# Patient Record
Sex: Female | Born: 1965 | Race: White | Hispanic: No | Marital: Single | Smoking: Never smoker
Health system: Southern US, Community
[De-identification: ages and names within clinical notes are randomized; demographics above are authoritative.]

## PROBLEM LIST (undated history)

## (undated) DIAGNOSIS — F411 Generalized anxiety disorder: Secondary | ICD-10-CM

## (undated) DIAGNOSIS — T7840XA Allergy, unspecified, initial encounter: Secondary | ICD-10-CM

## (undated) DIAGNOSIS — E039 Hypothyroidism, unspecified: Secondary | ICD-10-CM

## (undated) DIAGNOSIS — F329 Major depressive disorder, single episode, unspecified: Secondary | ICD-10-CM

## (undated) DIAGNOSIS — L309 Dermatitis, unspecified: Secondary | ICD-10-CM

## (undated) HISTORY — PX: BREAST SURGERY: SHX581

## (undated) HISTORY — DX: Dermatitis, unspecified: L30.9

## (undated) HISTORY — DX: Major depressive disorder, single episode, unspecified: F32.9

## (undated) HISTORY — DX: Generalized anxiety disorder: F41.1

## (undated) HISTORY — DX: Allergy, unspecified, initial encounter: T78.40XA

## (undated) HISTORY — DX: Hypothyroidism, unspecified: E03.9

## (undated) HISTORY — PX: BREAST LUMPECTOMY: SHX2

---

## 1997-06-15 ENCOUNTER — Ambulatory Visit (HOSPITAL_BASED_OUTPATIENT_CLINIC_OR_DEPARTMENT_OTHER): Admission: RE | Admit: 1997-06-15 | Discharge: 1997-06-15 | Payer: Self-pay | Admitting: Specialist

## 1998-02-13 ENCOUNTER — Other Ambulatory Visit: Admission: RE | Admit: 1998-02-13 | Discharge: 1998-02-13 | Payer: Self-pay | Admitting: Family Medicine

## 1999-04-15 ENCOUNTER — Encounter: Admission: RE | Admit: 1999-04-15 | Discharge: 1999-04-15 | Payer: Self-pay | Admitting: Family Medicine

## 1999-04-15 ENCOUNTER — Encounter: Payer: Self-pay | Admitting: Family Medicine

## 2000-06-24 ENCOUNTER — Encounter: Admission: RE | Admit: 2000-06-24 | Discharge: 2000-06-24 | Payer: Self-pay | Admitting: Family Medicine

## 2000-06-24 ENCOUNTER — Encounter: Payer: Self-pay | Admitting: Family Medicine

## 2001-07-19 ENCOUNTER — Encounter: Admission: RE | Admit: 2001-07-19 | Discharge: 2001-07-19 | Payer: Self-pay | Admitting: Family Medicine

## 2001-07-19 ENCOUNTER — Encounter: Payer: Self-pay | Admitting: Family Medicine

## 2002-07-21 ENCOUNTER — Encounter: Payer: Self-pay | Admitting: Family Medicine

## 2002-07-21 ENCOUNTER — Encounter: Admission: RE | Admit: 2002-07-21 | Discharge: 2002-07-21 | Payer: Self-pay | Admitting: Family Medicine

## 2004-12-12 ENCOUNTER — Ambulatory Visit: Payer: Self-pay | Admitting: Family Medicine

## 2005-01-08 ENCOUNTER — Ambulatory Visit (HOSPITAL_COMMUNITY): Admission: RE | Admit: 2005-01-08 | Discharge: 2005-01-08 | Payer: Self-pay | Admitting: Family Medicine

## 2006-01-15 ENCOUNTER — Ambulatory Visit (HOSPITAL_COMMUNITY): Admission: RE | Admit: 2006-01-15 | Discharge: 2006-01-15 | Payer: Self-pay | Admitting: Family Medicine

## 2007-01-13 LAB — HM MAMMOGRAPHY: HM Mammogram: NORMAL

## 2007-02-17 ENCOUNTER — Encounter: Admission: RE | Admit: 2007-02-17 | Discharge: 2007-02-17 | Payer: Self-pay | Admitting: Family Medicine

## 2007-10-13 LAB — CONVERTED CEMR LAB: Pap Smear: NORMAL

## 2008-04-07 ENCOUNTER — Encounter: Admission: RE | Admit: 2008-04-07 | Discharge: 2008-04-07 | Payer: Self-pay | Admitting: Family Medicine

## 2008-04-30 ENCOUNTER — Encounter: Admission: RE | Admit: 2008-04-30 | Discharge: 2008-05-11 | Payer: Self-pay | Admitting: Neurosurgery

## 2008-08-14 ENCOUNTER — Ambulatory Visit: Payer: Self-pay | Admitting: Family Medicine

## 2008-08-14 DIAGNOSIS — F411 Generalized anxiety disorder: Secondary | ICD-10-CM

## 2008-08-14 DIAGNOSIS — E039 Hypothyroidism, unspecified: Secondary | ICD-10-CM | POA: Insufficient documentation

## 2008-08-14 DIAGNOSIS — F339 Major depressive disorder, recurrent, unspecified: Secondary | ICD-10-CM | POA: Insufficient documentation

## 2008-08-14 DIAGNOSIS — F329 Major depressive disorder, single episode, unspecified: Secondary | ICD-10-CM

## 2008-08-14 DIAGNOSIS — F3289 Other specified depressive episodes: Secondary | ICD-10-CM

## 2008-08-14 HISTORY — DX: Generalized anxiety disorder: F41.1

## 2008-08-14 HISTORY — DX: Hypothyroidism, unspecified: E03.9

## 2008-08-14 HISTORY — DX: Other specified depressive episodes: F32.89

## 2008-08-14 HISTORY — DX: Major depressive disorder, single episode, unspecified: F32.9

## 2008-09-25 ENCOUNTER — Ambulatory Visit (HOSPITAL_COMMUNITY): Admission: RE | Admit: 2008-09-25 | Discharge: 2008-09-25 | Payer: Self-pay | Admitting: Internal Medicine

## 2010-07-22 ENCOUNTER — Other Ambulatory Visit (HOSPITAL_COMMUNITY): Payer: Self-pay | Admitting: Obstetrics and Gynecology

## 2010-07-22 DIAGNOSIS — R1031 Right lower quadrant pain: Secondary | ICD-10-CM

## 2010-07-24 ENCOUNTER — Ambulatory Visit (HOSPITAL_COMMUNITY): Payer: Self-pay

## 2010-12-16 ENCOUNTER — Telehealth: Payer: Self-pay | Admitting: Family Medicine

## 2010-12-16 NOTE — Telephone Encounter (Signed)
I have attempted to contact this patient by phone with the following results: left message to return my call on answering machine (home).  

## 2010-12-19 NOTE — Telephone Encounter (Signed)
No return call from Nancy Choi.  Follow up ended.

## 2011-05-29 ENCOUNTER — Encounter: Payer: Self-pay | Admitting: Family Medicine

## 2011-06-01 ENCOUNTER — Ambulatory Visit (INDEPENDENT_AMBULATORY_CARE_PROVIDER_SITE_OTHER): Payer: BC Managed Care – PPO | Admitting: Family Medicine

## 2011-06-01 ENCOUNTER — Encounter: Payer: Self-pay | Admitting: Family Medicine

## 2011-06-01 VITALS — BP 80/60 | Temp 97.8°F | Wt 132.0 lb

## 2011-06-01 DIAGNOSIS — H04123 Dry eye syndrome of bilateral lacrimal glands: Secondary | ICD-10-CM

## 2011-06-01 DIAGNOSIS — H04129 Dry eye syndrome of unspecified lacrimal gland: Secondary | ICD-10-CM

## 2011-06-01 MED ORDER — CYCLOSPORINE 0.05 % OP EMUL: 1.0000 [drp] | Freq: Two times a day (BID) | OPHTHALMIC | Status: AC

## 2011-06-01 NOTE — Patient Instructions (Signed)
We will call you regarding lab results  Sjgren's Syndrome Sjgren's syndrome is a disease in which the body's natural defense system (immune system) turns against the body's own cells and attacks the body's glands that produce tears and saliva. Sjgren's syndrome is sometimes linked to rheumatic disorders, such as rheumatoid arthritis and lupus. It is 10 times more common in women than in men. Most people with Sjgren's syndrome are from 29 to 47 years of age. CAUSES  The cause is unknown. It runs in families. It is possible that a trigger, such as a viral infection, can set it off. SYMPTOMS  The main symptoms are:  Dry mouth.   Chalky feeling, mouth feels like it is full of cotton.   Difficulty swallowing, speaking, or tasting.   Prone to cavities and mouth infections.   Dry eyes.   Burning, itching, feels like sand in the eyes.   Blurry vision.   Light sensitive.  Other symptoms may include:  Skin, nose, and vaginal dryness.   Joint pain, stiffness, and muscle pain.  Other organs that may be affected include:  Kidneys.   Blood vessels.   Lungs.   Liver.   Pancreas.   Brain.  DIAGNOSIS  Diagnosis is first based on symptoms, medical history, and physical exam. Tests may also be done, including:  Tear production test (Schirmer's test).   A thorough eye exam using a magnifying device (slit-lamp exam).   Test to see the extent of eye damage using dye staining.   Mouth exam to look for signs of salivary gland swelling and mouth dryness.   Removal of a minor salivary gland from inside the lower lip to be studied under a microscope (lip biopsy).   Blood tests. Routine and special blood studies will be checked. Antibodies that attack normal tissue (autoantibodies) may be present, such as antinuclear antibodies (ANAs), rheumatoid factors, and Sjgren's antibodies (anti-SSA and anti-SSB).   Chest X-ray.   Urine tests (urinalysis).  TREATMENT  There is no known  cure for this syndrome. There is no specific treatment to restore gland secretion, either. Treatment varies and is generally based upon the problems present.  Moisture replacement therapies may ease the symptoms of dryness.   Nonsteroidal anti-inflammatory drugs (NSAIDs), such as ibuprofen, may be used to treat musculoskeletal symptoms.   Corticosteroids, such as prednisone, may be given for people with severe complications.   Immunosuppressive drugs may be prescribed to control overactivity of the immune system. In severe cases, this overactivity can lead to organ damage.   A surgical procedure called punctal occlusion may be considered. This is done to close the tear ducts, helping to keep more natural tears on the eye's surface.  A small percentage of people with Sjgren's syndrome develop lymphoma. If you are worried that you might develop lymphoma, talk to your caregiver to learn more about the disease and the symptoms to watch. HOME CARE INSTRUCTIONS   Eye care:   Use eyedrops as specified by your caregiver.   Try to blink 5 to 6 times per minute.   Protect your eyes from drafts and breezes.   Maintain properly humidified air.   Avoid smoke.   Mouth care:   Sugar-free gum and hard candy can help in some people.   Take frequent sips of water or sugar-free drinks.   Use lip balm, saliva substitutes, and prescription medicines as directed.  SEEK MEDICAL CARE IF:   You have an oral temperature above 102 F (38.9 C).   You  have night sweats.   You develop constant fatigue.   You have unexplained weight loss.   You develop itchy skin.   You have reddened patches on the skin.  FOR MORE INFORMATION  Sjgren's Syndrome Foundation: [email or web address].[email or web address] of Arthritis and Musculoskeletal and Skin Diseases: [email or web address]/ Document Released: 12/19/2001 Document Revised: 12/18/2010 Document Reviewed: 05/06/2009 [employer] Patient Information 2012 [place],  [place].

## 2011-06-01 NOTE — Progress Notes (Signed)
  Subjective:    Patient ID: Nancy Choi, female    DOB: 1965/06/19, 46 y.o.   MRN: [id number]  HPI  Complains of severe dry eyes right greater than left. She has hypothyroidism followed by someone else with recent TSH reportedly normal. Also had recent ophthalmology appointment normal eye exam. She has dryness in both eyes and frequent irritation. Occasional photophobia. No drainage. No blurred vision. Occasional mild eye pain. Ophthalmologist prescribed ketorolac eyedrops which have not helped. She's tried refresh eye drops which help minimally. Previously used Restasis with good success and is requesting prescription. She also frequently has dry mouth. No urine frequency. She attributes dry mouth to Effexor. No family history of connective tissue disorder. Never tested for Sjogren's syndrome.   Review of Systems  Constitutional: Negative for fever and chills.  Eyes: Positive for photophobia. Negative for discharge, redness, itching and visual disturbance.  Musculoskeletal: Negative for myalgias, back pain and arthralgias.       Objective:   Physical Exam  Constitutional: She appears well-developed and well-nourished.  HENT:  Mouth/Throat: Oropharynx is clear and moist.  Eyes: Conjunctivae and EOM are normal. Pupils are equal, round, and reactive to light. Right eye exhibits no discharge. Left eye exhibits no discharge. No scleral icterus.  Neck: Neck supple. No thyromegaly present.  Cardiovascular: Normal rate and regular rhythm.   Pulmonary/Chest: Effort normal and breath sounds normal. No respiratory distress. She has no wheezes. She has no rales.          Assessment & Plan:  Patient complains of dry eyes and dry mouth. Check Sjogren's antibody panel. Prescription for Restasis to use as needed.

## 2011-06-02 LAB — SJOGREN'S SYNDROME ANTIBODS(SSA + SSB)
SSA (Ro) (ENA) Antibody, IgG: 1 AU/mL (ref <30)
SSB (La) (ENA) Antibody, IgG: <1 AU/mL (ref <30)

## 2011-06-03 NOTE — Progress Notes (Signed)
Quick Note:  Pt informed ______ 

## 2011-08-31 ENCOUNTER — Ambulatory Visit (INDEPENDENT_AMBULATORY_CARE_PROVIDER_SITE_OTHER): Payer: BC Managed Care – PPO | Admitting: Family Medicine

## 2011-08-31 ENCOUNTER — Encounter: Payer: Self-pay | Admitting: Family Medicine

## 2011-08-31 VITALS — BP 102/68 | Temp 101.8°F | Wt 134.0 lb

## 2011-08-31 DIAGNOSIS — R509 Fever, unspecified: Secondary | ICD-10-CM

## 2011-08-31 DIAGNOSIS — J029 Acute pharyngitis, unspecified: Secondary | ICD-10-CM

## 2011-08-31 LAB — POCT RAPID STREP A (OFFICE): Rapid Strep A Screen: NEGATIVE

## 2011-08-31 MED ORDER — AMOXICILLIN 875 MG PO TABS: 875.0000 mg | ORAL_TABLET | Freq: Two times a day (BID) | ORAL | Status: AC

## 2011-08-31 NOTE — Patient Instructions (Addendum)
Continue Tylenol and/or Motrin for fever and body aches.

## 2011-08-31 NOTE — Progress Notes (Signed)
  Subjective:    Patient ID: Nancy Choi, female    DOB: 1966-01-02, 46 y.o.   MRN: [id number]  HPI  Acute visit. Patient presents with sore throat, chills, bodyaches with onset yesterday fairly acutely. No sick exposures though was around several nieces and nephews over the weekend. No cough. Minimal  nasal congestion. No headache. Mild nausea but no vomiting. She took Advil and Tylenol yesterday with mild relief. No skin rash. No abdominal pain. No dysuria   Review of Systems As above    Objective:   Physical Exam  Constitutional: She appears well-developed and well-nourished.  HENT:  Right Ear: External ear normal.  Left Ear: External ear normal.       Posterior pharynx is erythematous. No exudate  Neck: Neck supple.       Anterior cervical adenopathy. No posterior cervical nodes.  Cardiovascular: Normal rate and regular rhythm.   Pulmonary/Chest: Effort normal and breath sounds normal. No respiratory distress. She has no wheezes. She has no rales.  Lymphadenopathy:    She has cervical adenopathy.  Skin: No rash noted.          Assessment & Plan:  Pharyngitis. Rapid strep negative. May be all viral but concern for possible strep with lack of symptoms such as nasal congestion and cough. Throat culture sent. Cover with amoxicillin 875 mg twice a day in the meantime.

## 2012-01-29 ENCOUNTER — Other Ambulatory Visit: Payer: Self-pay | Admitting: Obstetrics and Gynecology

## 2012-01-29 DIAGNOSIS — R928 Other abnormal and inconclusive findings on diagnostic imaging of breast: Secondary | ICD-10-CM

## 2012-02-10 ENCOUNTER — Ambulatory Visit
Admission: RE | Admit: 2012-02-10 | Discharge: 2012-02-10 | Disposition: A | Discharge status: Home / Self Care | Payer: BC Managed Care – PPO | Source: Ambulatory Visit | Attending: Obstetrics and Gynecology | Admitting: Obstetrics and Gynecology

## 2012-02-10 DIAGNOSIS — R928 Other abnormal and inconclusive findings on diagnostic imaging of breast: Secondary | ICD-10-CM

## 2012-09-07 ENCOUNTER — Other Ambulatory Visit: Payer: Self-pay | Admitting: Obstetrics and Gynecology

## 2012-09-07 DIAGNOSIS — N63 Unspecified lump in unspecified breast: Secondary | ICD-10-CM

## 2012-09-21 ENCOUNTER — Ambulatory Visit (INDEPENDENT_AMBULATORY_CARE_PROVIDER_SITE_OTHER): Payer: BC Managed Care – PPO | Admitting: Family Medicine

## 2012-09-21 ENCOUNTER — Encounter: Payer: Self-pay | Admitting: Family Medicine

## 2012-09-21 VITALS — BP 102/70 | HR 123 | Temp 98.2°F | Ht 68.0 in | Wt 133.0 lb

## 2012-09-21 DIAGNOSIS — H6120 Impacted cerumen, unspecified ear: Secondary | ICD-10-CM

## 2012-09-21 DIAGNOSIS — E785 Hyperlipidemia, unspecified: Secondary | ICD-10-CM

## 2012-09-21 DIAGNOSIS — H6122 Impacted cerumen, left ear: Secondary | ICD-10-CM

## 2012-09-21 LAB — LIPID PANEL
LDL Cholesterol: 110 mg/dL — ABNORMAL HIGH (ref 0–99)
Total CHOL/HDL Ratio: 3

## 2012-09-21 NOTE — Progress Notes (Signed)
  Subjective:    Patient ID: Nancy Choi, female    DOB: 1965-02-15, 47 y.o.   MRN: [id number]  HPI Patient seen for the following issues Decreased hearing left ear. Symptoms are intermittent. Onset a few days ago. She first noted after getting some water in her ear. She has not had any consistent vertigo symptoms. No tinnitus. Denies any ear pain.  Also requesting lipid check. She's had hypercholesterolemia in the past but also excellent HDL. Overall low risk for CAD. Nonsmoker. She takes thyroid replacement and sees another physician to monitor that. Overall feels well  Past Medical History  Diagnosis Date  . HYPOTHYROIDISM 08/14/2008    Qualifier: Diagnosis of  By: Gabriel Rung LPN, Harriett Sine    . Generalized anxiety disorder 08/14/2008    Qualifier: Diagnosis of  By: Gabriel Rung LPN, Harriett Sine    . DEPRESSION 08/14/2008    Qualifier: Diagnosis of  By: Gabriel Rung LPN, Harriett Sine     No past surgical history on file.  reports that she has never smoked. She does not have any smokeless tobacco history on file. Her alcohol and drug histories are not on file. family history includes Cancer in her mother; Depression in her mother; Diabetes in her paternal grandmother; Hyperlipidemia in her father. Allergies  Allergen Reactions  . Codeine Sulfate     REACTION: irritable, bugs crawling on skin sensation      Review of Systems  Constitutional: Negative for fever and appetite change.  HENT: Positive for hearing loss. Negative for ear pain and ear discharge.        Objective:   Physical Exam  Constitutional: She appears well-developed and well-nourished.  HENT:  Minimal cerumen right canal. Cerumen impaction left canal  Neck: Neck supple.  Cardiovascular: Normal rate and regular rhythm.   Pulmonary/Chest: Effort normal and breath sounds normal. No respiratory distress. She has no wheezes. She has no rales.          Assessment & Plan:  #1 cerumen impaction left canal. Irrigation #2 hyperlipidemia.  Recheck fasting lipid panel. Overall low risk for atherosclerosis

## 2012-09-27 ENCOUNTER — Ambulatory Visit
Admission: RE | Admit: 2012-09-27 | Discharge: 2012-09-27 | Disposition: A | Discharge status: Home / Self Care | Payer: BC Managed Care – PPO | Source: Ambulatory Visit | Attending: Obstetrics and Gynecology | Admitting: Obstetrics and Gynecology

## 2012-09-27 DIAGNOSIS — N63 Unspecified lump in unspecified breast: Secondary | ICD-10-CM

## 2012-10-05 ENCOUNTER — Telehealth: Payer: Self-pay | Admitting: Family Medicine

## 2012-10-05 NOTE — Telephone Encounter (Signed)
Patient Information:  Caller Name: Kanani  Phone: [phone]  Patient: Nancy Choi, Nancy Choi  Gender: Female  DOB: 09/25/1965  Age: 47 Years  PCP: Evelena Peat [facility])  Pregnant: No  Office Follow Up:  Does the office need to follow up with this patient?: No  Instructions For The Office: N/A  RN Note:  Care advise provided understanding expressed.  Appt scheudled  Symptoms  Reason For Call & Symptoms: Patient is complaining of Chest cold, coughing since Friday 09/30/12 . At times chills and hot but has not taken temperature , +coughing non productive, back is sore from coughing.  Tiredness,  "All in my Chest".  no n/v/d . Eating and drinking normally. Episodic coughing and occasional wheeze  Reviewed Health History In EMR: Yes  Reviewed Medications In EMR: Yes  Reviewed Allergies In EMR: Yes  Reviewed Surgeries / Procedures: Yes  Date of Onset of Symptoms: 09/30/2012  Treatments Tried: Advil , OTC Delsym  Treatments Tried Worked: No  Any Fever: Yes  Fever Taken: Tactile  Fever Time Of Reading: 00:00:00  Fever Last Reading: N/A OB / GYN:  LMP: 09/24/2012  Guideline(s) Used:  Cough  Disposition Per Guideline:   See Today or Tomorrow in Office  Reason For Disposition Reached:   Continuous (nonstop) coughing interferes with work or school and no improvement using cough treatment per Care Advice  Advice Given:  Coughing Spasms:  Drink warm fluids. Inhale warm mist (Reason: both relax the airway and loosen up the phlegm).  Suck on cough drops or hard candy to coat the irritated throat.  Avoid Tobacco Smoke:  Smoking or being exposed to smoke makes coughs much worse.  Call Back If:  Difficulty breathing  Cough lasts more than 3 weeks  Fever lasts > 3 days  You become worse.  Caution - Dextromethorphan:   CONTRAINDICATED: Do not take dextromethorphan if you are taking a monoamine oxidase (MAO) inhibitor now or in the past 2 weeks. Examples of MAO inhibitors  include isocarboxazid (Marplan), phenelzine (Nardil), selegiline (Eldepryl, Emsam, Zelapar), and tranylcypromine (Parnate). Do not take dextromethorphan if you are taking venlafaxine (Effexor).  Cough Medicines:  Home Remedy - Hard Candy: Hard candy works just as well as medicine-flavored OTC cough drops. Diabetics should use sugar-free candy.  Home Remedy - Honey: This old home remedy has been shown to help decrease coughing at night. The adult dosage is 2 teaspoons (10 ml) at bedtime. Honey should not be given to infants under one year of age.  Patient Will Follow Care Advice:  YES  Appointment Scheduled:  10/06/2012 08:30:00 Appointment Scheduled Provider:  Darryll Capers (Adults only)

## 2012-10-06 ENCOUNTER — Encounter: Payer: Self-pay | Admitting: Family Medicine

## 2012-10-06 ENCOUNTER — Ambulatory Visit: Payer: Self-pay | Admitting: Internal Medicine

## 2012-10-06 ENCOUNTER — Ambulatory Visit (INDEPENDENT_AMBULATORY_CARE_PROVIDER_SITE_OTHER): Payer: BC Managed Care – PPO | Admitting: Family Medicine

## 2012-10-06 VITALS — BP 112/80 | HR 101 | Temp 98.6°F | Wt 132.0 lb

## 2012-10-06 DIAGNOSIS — B9789 Other viral agents as the cause of diseases classified elsewhere: Secondary | ICD-10-CM

## 2012-10-06 DIAGNOSIS — B349 Viral infection, unspecified: Secondary | ICD-10-CM

## 2012-10-06 MED ORDER — HYDROCODONE-HOMATROPINE 5-1.5 MG/5ML PO SYRP: 5.0000 mL | ORAL_SOLUTION | Freq: Four times a day (QID) | ORAL | Status: AC | PRN

## 2012-10-06 NOTE — Progress Notes (Signed)
  Subjective:    Patient ID: Nancy Choi, female    DOB: 1965/12/19, 47 y.o.   MRN: [id number]  HPI Acute visit. Patient seen with onset of illness last Friday. She's had mostly cough, headache, malaise, sore throat. Minimal nasal congestion. No nausea or vomiting. Denies any documented fever or chills. Cough is been especially bothersome. Not relieved with over-the-counter medications. She's tried Delsym. Patient is nonsmoker. No history of lung disease.  Past Medical History  Diagnosis Date  . HYPOTHYROIDISM 08/14/2008    Qualifier: Diagnosis of  By: Gabriel Rung LPN, Harriett Sine    . Generalized anxiety disorder 08/14/2008    Qualifier: Diagnosis of  By: Gabriel Rung LPN, Harriett Sine    . DEPRESSION 08/14/2008    Qualifier: Diagnosis of  By: Gabriel Rung LPN, Harriett Sine     No past surgical history on file.  reports that she has never smoked. She does not have any smokeless tobacco history on file. Her alcohol and drug histories are not on file. family history includes Cancer in her mother; Depression in her mother; Diabetes in her paternal grandmother; Hyperlipidemia in her father. Allergies  Allergen Reactions  . Codeine Sulfate     REACTION: irritable, bugs crawling on skin sensation      Review of Systems  Constitutional: Positive for fatigue.  HENT: Positive for sore throat.   Respiratory: Positive for cough. Negative for wheezing.   Cardiovascular: Negative for chest pain.  Gastrointestinal: Negative for nausea and vomiting.  Neurological: Positive for headaches.       Objective:   Physical Exam  Constitutional: She appears well-developed and well-nourished.  HENT:  Right Ear: External ear normal.  Left Ear: External ear normal.  Mouth/Throat: Oropharynx is clear and moist.  Neck: Neck supple.  Cardiovascular: Normal rate and regular rhythm.   Pulmonary/Chest: Effort normal and breath sounds normal. No respiratory distress. She has no wheezes. She has no rales.  Lymphadenopathy:    She has no  cervical adenopathy.          Assessment & Plan:  Probable viral syndrome. Cough is her predominant symptom. Hycodan cough syrup for nighttime use for suppression. Plenty of fluids. Followup promptly for any fever or worsening symptoms.

## 2012-10-06 NOTE — Patient Instructions (Addendum)

## 2012-10-13 ENCOUNTER — Telehealth: Payer: Self-pay | Admitting: Family Medicine

## 2012-10-13 MED ORDER — AZITHROMYCIN 250 MG PO TABS: 250.0000 mg | ORAL_TABLET | Freq: Every day | ORAL | Status: DC

## 2012-10-13 NOTE — Telephone Encounter (Signed)
Pls advise.  

## 2012-10-13 NOTE — Telephone Encounter (Signed)
Ok to call in Zithromax (5 day pack).

## 2012-10-13 NOTE — Telephone Encounter (Signed)
Patient Information:  Caller Name: Nancy Choi  Phone: [phone]  Patient: Nancy Choi  Gender: Female  DOB: 1965/08/05  Age: 47 Years  PCP: Evelena Peat [facility])  Pregnant: No  Office Follow Up:  Does the office need to follow up with this patient?: Yes  Instructions For The Office: Patient requests antibiotic  RN Note:  Patient does not want to be seen in the office.  She was told by Dr. Caryl Never that he would call in antibiotic if she was not feeling better by this week.  Symptoms  Reason For Call & Symptoms: Patient has been sick since last week.  She was told that she had bronchitis in the office last week.  10/06/2012.  She was given a Hydrocodone cough syrup.  This is not working.  She is now having a lot of pressure in her head and some dizziness.  She is blowing out Yellow.  She would like an antibiotic called in.  Reviewed Health History In EMR: Yes  Reviewed Medications In EMR: Yes  Reviewed Allergies In EMR: Yes  Reviewed Surgeries / Procedures: Yes  Date of Onset of Symptoms: 09/30/2012  Treatments Tried: Hydrocodone syrup  Treatments Tried Worked: No OB / GYN:  LMP: 09/24/2012  Guideline(s) Used:  Sinus Pain and Congestion  Disposition Per Guideline:   See Today or Tomorrow in Office  Reason For Disposition Reached:   Sinus congestion (pressure, fullness) present > 10 days  Advice Given:  N/A  Patient Will Follow Care Advice:  YES

## 2012-10-13 NOTE — Telephone Encounter (Signed)
RX sent to pharmacy and Pt is aware 

## 2013-06-06 ENCOUNTER — Other Ambulatory Visit: Payer: Self-pay | Admitting: Obstetrics and Gynecology

## 2013-06-06 DIAGNOSIS — N63 Unspecified lump in unspecified breast: Secondary | ICD-10-CM

## 2013-06-08 ENCOUNTER — Ambulatory Visit: Payer: BC Managed Care – PPO | Admitting: Family Medicine

## 2013-06-08 ENCOUNTER — Encounter: Payer: Self-pay | Admitting: Family Medicine

## 2013-06-08 ENCOUNTER — Ambulatory Visit (INDEPENDENT_AMBULATORY_CARE_PROVIDER_SITE_OTHER): Payer: BC Managed Care – PPO | Admitting: Family Medicine

## 2013-06-08 VITALS — BP 110/70 | HR 84 | Wt 135.0 lb

## 2013-06-08 DIAGNOSIS — R519 Headache, unspecified: Secondary | ICD-10-CM

## 2013-06-08 DIAGNOSIS — R51 Headache: Secondary | ICD-10-CM

## 2013-06-08 NOTE — Progress Notes (Signed)
Pre visit review using our clinic review tool, if applicable. No additional management support is needed unless otherwise documented below in the visit note. 

## 2013-06-08 NOTE — Patient Instructions (Signed)
We will call you with neurology appointment

## 2013-06-08 NOTE — Progress Notes (Signed)
Subjective:    Patient ID: Nancy Choi, female    DOB: 03-04-1965, 48 y.o.   MRN: [id number]  HPI Patient seen with chief complaint of almost 3 month history of right facial pain. Her pain seemed to initiate around the TMJ region but has spread and now includes the maxillary region as well as upper lip and occasionally lower lip region as well. Since March she's been to 2 different dentists. She had root canal which did not help. She's had x-rays which did not reveal any dental decay. She was placed on Advil which helps slightly and one course of prednisone which did help. She was prescribed amitriptyline by another dentist for possible neuralgia but she refused to take this because of prior history of side effects to that. She states she was also prescribed multiple courses of antibiotics including penicillin, amoxicillin, and Avelox without improvement.  She describes the pain as relatively constant but waxing and waning in severity. Severity ranges from 1.5-7/10. She describes achy dull quality but also occasional sharp shooting pains. She has noted that triggering factors seem to be hot or cold foods or even air currents. She denies any facial rash, facial edema, weakness, dysphagia, appetite or weight changes, hearing changes, or any recent visual changes. She does complain of some nonspecific lightheadedness. No vertigo. Occasional mild headaches but no localized headaches or progressive headaches.  Past Medical History  Diagnosis Date  . HYPOTHYROIDISM 08/14/2008    Qualifier: Diagnosis of  By: Gabriel Rung LPN, Harriett Sine    . Generalized anxiety disorder 08/14/2008    Qualifier: Diagnosis of  By: Gabriel Rung LPN, Harriett Sine    . DEPRESSION 08/14/2008    Qualifier: Diagnosis of  By: Gabriel Rung LPN, Harriett Sine     No past surgical history on file.  reports that she has never smoked. She does not have any smokeless tobacco history on file. Her alcohol and drug histories are not on file. family history includes Cancer  in her mother; Depression in her mother; Diabetes in her paternal grandmother; Hyperlipidemia in her father. Allergies  Allergen Reactions  . Codeine Sulfate     REACTION: irritable, bugs crawling on skin sensation      Review of Systems  Constitutional: Negative for fever and chills.  HENT: Negative for congestion, ear discharge, hearing loss, sore throat and trouble swallowing.   Eyes: Negative for visual disturbance.  Respiratory: Negative for cough and shortness of breath.   Skin: Negative for rash.  Neurological: Negative for dizziness.  Hematological: Negative for adenopathy.       Objective:   Physical Exam  Constitutional: She is oriented to person, place, and time. She appears well-developed and well-nourished.  HENT:  Head: Normocephalic and atraumatic.  Right Ear: External ear normal.  Left Ear: External ear normal.  Mouth/Throat: Oropharynx is clear and moist.  Minimally tender right TMJ region. No palpable click.   Eyes: Pupils are equal, round, and reactive to light.  Neck: Neck supple.  Cardiovascular: Normal rate and regular rhythm.  Exam reveals no gallop.   No murmur heard. Pulmonary/Chest: Effort normal and breath sounds normal. No respiratory distress. She has no wheezes. She has no rales.  Lymphadenopathy:    She has no cervical adenopathy.  Neurological: She is alert and oriented to person, place, and time. No cranial nerve deficit.  Skin: No rash noted.          Assessment & Plan:  Right facial pain. Duration about 3 months. Intermittent nature, sharp quality, and triggering factors  of cold, heat, and air suggest possible neuralgia. She does have some pain around her TMJ joint though distribution of pain would seem more consistent with neuralgia. Also, she has not had any significant pain with eating. We'll set up neurology appointment for second opinion. She agrees with this plan.

## 2013-06-09 ENCOUNTER — Telehealth: Payer: Self-pay | Admitting: Family Medicine

## 2013-06-09 MED ORDER — PREDNISONE 10 MG PO TABS: ORAL_TABLET | ORAL | Status: DC

## 2013-06-09 NOTE — Telephone Encounter (Signed)
Patient Information:  Caller Name: Anabia  Phone: [phone]  Patient: Nancy Choi, Nancy Choi  Gender: Female  DOB: 04-Dec-1965  Age: 48 Years  PCP: Evelena Peat [facility])  Pregnant: No  Office Follow Up:  Does the office need to follow up with this patient?: Yes  Instructions For The Office: See Reason for Call & Symptoms.   Symptoms  Reason For Call & Symptoms: Facial Pain since the beginning of March 2015. Neuro referral was recommended by Dr. Caryl Never at yesterday's visit, 5/28. Patient is awaiting an appointment with Neuro and reports facial pain has worsened today, 5/29,  since being seen yesterday by Dr. Caryl Never and she is concerned that if worsened over the course of weekend she could end up in ED. Patient is calling now to request a course of Prednisone as this is the only thing that has helped in the past. Patient states the pain is "pretty bad" right now though she declines an appointment per protocol stating "I just need medication." PLEASE ADVISE AND FOLLOW UP WITH PATIENT REGARDING HER REQUEST FOR ANOTHER COURSE OF PREDNISONE. THANK YOU.  Reviewed Health History In EMR: Yes  Reviewed Medications In EMR: Yes  Reviewed Allergies In EMR: Yes  Reviewed Surgeries / Procedures: Yes  Date of Onset of Symptoms: 06/09/2013 OB / GYN:  LMP: 06/01/2013  Guideline(s) Used:  Face Pain  Disposition Per Guideline:   Go to Office Now  Reason For Disposition Reached:   Severe pain (e.g., excruciating, unable to do any normal activities)  Advice Given:  N/A  Patient Refused Recommendation:  Patient Will Follow Up With Office Later  See Reason for Call & Symptoms.

## 2013-06-09 NOTE — Telephone Encounter (Signed)
Rx sent to pharmacy and pt is aware. 

## 2013-06-09 NOTE — Telephone Encounter (Signed)
Returned voicemail call back to patient.  No answer.  Left message to call.

## 2013-06-09 NOTE — Telephone Encounter (Signed)
Prednisone 10 mg taper :6-5-4-4-3-2-1 disp #25

## 2013-06-09 NOTE — Telephone Encounter (Signed)
Patient calling in follow up regarding request for prednisone prescription 06/09/13.  States has facial pain and was seen by Dr. Caryl Never 06/08/13.  States she wants to renew her course of prednisone "and prevent a trip to ED this weekend."  Info to office for staff/provider review/Rx/callback.  May reach patient at [phone].  krs/can

## 2013-06-16 ENCOUNTER — Encounter: Payer: Self-pay | Admitting: Neurology

## 2013-06-16 ENCOUNTER — Telehealth: Payer: Self-pay | Admitting: Family Medicine

## 2013-06-16 ENCOUNTER — Ambulatory Visit (INDEPENDENT_AMBULATORY_CARE_PROVIDER_SITE_OTHER): Payer: BC Managed Care – PPO | Admitting: Neurology

## 2013-06-16 VITALS — BP 120/78 | HR 90 | Temp 98.4°F

## 2013-06-16 DIAGNOSIS — R519 Headache, unspecified: Secondary | ICD-10-CM

## 2013-06-16 DIAGNOSIS — R292 Abnormal reflex: Secondary | ICD-10-CM

## 2013-06-16 DIAGNOSIS — R51 Headache: Secondary | ICD-10-CM

## 2013-06-16 LAB — SEDIMENTATION RATE: SED RATE: 11 mm/h (ref 0–22)

## 2013-06-16 LAB — BASIC METABOLIC PANEL
BUN: 12 mg/dL (ref 6–23)
CO2: 31 mEq/L (ref 19–32)
Calcium: 9.2 mg/dL (ref 8.4–10.5)
Chloride: 101 mEq/L (ref 96–112)
Creat: 0.47 mg/dL — ABNORMAL LOW (ref 0.50–1.10)
GLUCOSE: 89 mg/dL (ref 70–99)
POTASSIUM: 4 meq/L (ref 3.5–5.3)
Sodium: 140 mEq/L (ref 135–145)

## 2013-06-16 LAB — C-REACTIVE PROTEIN: CRP: <0.5 mg/dL (ref <0.60)

## 2013-06-16 MED ORDER — GABAPENTIN 100 MG PO CAPS: ORAL_CAPSULE | ORAL | Status: DC

## 2013-06-16 NOTE — Telephone Encounter (Signed)
Pt informed that since she has appt with Neurology that she keep that appt.

## 2013-06-16 NOTE — Telephone Encounter (Signed)
Caller: Nancy Choi/Patient; Phone: [phone]; Reason for Call: Please call pt for referral to ENT due to right sided facial pain.  Pt has neurology appt today at 1pm but doesn't believe this is neurologic.  Please call to discuss and advise.

## 2013-06-16 NOTE — Progress Notes (Signed)
NEUROLOGY CONSULTATION NOTE  Nancy Choi MRN: [id number] DOB: 1965-04-05  Referring provider: Dr. Carolann Littler Primary care provider: Dr. Carolann Littler  Reason for consult:  Right-sided facial pain  Dear Dr Nancy Choi:  Thank you for your kind referral of Nancy Choi for consultation of the above symptoms. Although her history is well known to you, please allow me to reiterate it for the purpose of our medical record. Records and images were personally reviewed where available.  HISTORY OF PRESENT ILLNESS: This is a 48 year old right-handed woman with a history of hypothyroidism, depression, and anxiety, in her usual state of health until 2013 when she started noticing sensitivity in her teeth on the right side.  She had seen a dentist and was told she had "cracks" in her teeth.  She also noticed a bad taste in the right side of her mouth around a year ago.  She notices she clenches her jaw at night.  In March 2015, she started having horrible pain in the region of her right upper molars.  She saw an orthodontist that performed a root canal on 06/06/13, and she states that she had the same pain going out of his office.  She saw a second dentist and was diagnosed with TMJ dysfunction with neuralgia, started on Advil 2 tabs q6hours. She noticed symptoms were improving at the beginning of May, however on 06/05/13 she woke up in pain, which has significantly been increasing to unbearable pain last weekend.  She became tearful that she would have another weekend in that kind of pain, but notes that she went to her dentist and was diagnosed with neuralgia, started on amitriptyline and Amoxicillin.  She took amitriptyline for 2 days, and states it did not help.  She reports that after day 2 of taking amoxicillin, the unbearable pain resolved.  She continues to have pain over the right side of her face, over the right temporal region, down to her cheek.  She has been taking Advil every 6 hours and  reports this has irritated her stomach.  Aleve "does nothing."  She was prescribed Prednisone last April, which helped with her symptoms, however last weekend another course of steroids only made the pain worse.  She describes the pain as an ache and soreness from the right lower jaw up to the right temporal region.  Her right ear feels full, no tinnitus. She feels that her bite "has been so off."  She feels like there is an infection in the jaw region, particularly since the symptoms improved after day 2 of Amoxicillin.  Bending down and eating made the pain worse, however she also reported that she could not eat or drink with significant cold sensitivity. She cannot wear glasses and lying on either side of her face causes pressure. The cold sensitivity has resolved.  She denies any sensitivity to water or wind on her face, but there is tenderness to touch. Since March, she has also been having intermittent episodes of dizziness described as a floating sensation, with no associated nausea/vomiting, no incoordination.  She has noticed her right eye would become blurred and dry, no loss of vision. She has pain in the right anterior neck region.  No focal numbness/tingling/weakness in the extremities, no back pain, bowel or bladder dysfunction. She denies any head injuries or infections prior to symptom onset. Her grandmother had trigeminal neuralgia, and she reports that her symptoms are very different.  PAST MEDICAL HISTORY: Past Medical History  Diagnosis Date  .  HYPOTHYROIDISM 08/14/2008    Qualifier: Diagnosis of  By: Valma Cava LPN, Izora Gala    . Generalized anxiety disorder 08/14/2008    Qualifier: Diagnosis of  By: Valma Cava LPN, Izora Gala    . DEPRESSION 08/14/2008    Qualifier: Diagnosis of  By: Valma Cava LPN, Izora Gala      PAST SURGICAL HISTORY: History reviewed. No pertinent past surgical history.  MEDICATIONS: Current Outpatient Prescriptions on File Prior to Visit  Medication Sig Dispense Refill  . ARMOUR  THYROID 60 MG tablet Take 60 mg by mouth 2 (two) times daily.       . Ascorbic Acid (VITAMIN C) 1000 MG tablet Take 1,000 mg by mouth daily.      . Cholecalciferol (VITAMIN D) 2000 UNITS CAPS Take by mouth.      . predniSONE (DELTASONE) 10 MG tablet Taper: 6-5-4-4-3-2-1. Use as instructed.  25 tablet  0  . progesterone (PROMETRIUM) 100 MG capsule       . pyridoxine (B-6) 200 MG tablet Take 200 mg by mouth daily.      Marland Kitchen venlafaxine XR (EFFEXOR-XR) 37.5 MG 24 hr capsule Take 37.5 mg by mouth daily.       . vitamin B-12 (CYANOCOBALAMIN) 100 MCG tablet Take 100 mcg by mouth daily.      . vitamin E 100 UNIT capsule Take 100 Units by mouth daily.       No current facility-administered medications on file prior to visit.    ALLERGIES: Allergies  Allergen Reactions  . Codeine Sulfate     REACTION: irritable, bugs crawling on skin sensation    FAMILY HISTORY: Family History  Problem Relation Age of Onset  . Cancer Mother     breast  . Depression Mother   . Hyperlipidemia Father   . Diabetes Paternal Grandmother     SOCIAL HISTORY: History   Social History  . Marital Status: Married    Spouse Name: N/A    Number of Children: N/A  . Years of Education: N/A   Occupational History  . Not on file.   Social History Main Topics  . Smoking status: Never Smoker   . Smokeless tobacco: Not on file  . Alcohol Use: Not on file  . Drug Use: Not on file  . Sexual Activity: Not on file   Other Topics Concern  . Not on file   Social History Narrative  . No narrative on file    REVIEW OF SYSTEMS: Constitutional: No fevers, chills, or sweats, no generalized fatigue, change in appetite Eyes: No visual changes, double vision, eye pain Ear, nose and throat: as above Cardiovascular: No chest pain, palpitations Respiratory:  No shortness of breath at rest or with exertion, wheezes GastrointestinaI: No nausea, vomiting, diarrhea, abdominal pain, fecal incontinence Genitourinary:  No  dysuria, urinary retention or frequency Musculoskeletal:  + neck pain, no back pain Integumentary: No rash, pruritus, skin lesions Neurological: as above Psychiatric: +depression, insomnia, anxiety Endocrine: No palpitations, fatigue, diaphoresis, mood swings, change in appetite, change in weight, increased thirst Hematologic/Lymphatic:  No anemia, purpura, petechiae. Allergic/Immunologic: no itchy/runny eyes, nasal congestion, recent allergic reactions, rashes  PHYSICAL EXAM: Filed Vitals:   06/16/13 1352  BP: 120/78  Pulse: 90  Temp: 98.4 F (36.9 C)   General: No acute distress Head:  Normocephalic/atraumatic. There is cerumen in the right ear canal, no vesicles noted. Eyes: Fundoscopic exam shows bilateral sharp discs, no vessel changes, exudates, or hemorrhages Neck: supple, no paraspinal tenderness, full range of motion Back: No paraspinal tenderness  Heart: regular rate and rhythm Lungs: Clear to auscultation bilaterally. Vascular: No carotid bruits. Skin/Extremities: No rash, no edema Neurological Exam: Mental status: alert and oriented to person, place, and time, no dysarthria or aphasia, Fund of knowledge is appropriate.  Recent and remote memory are intact.  Attention and concentration are normal.    Able to name objects and repeat phrases. Cranial nerves: CN I: not tested CN II: pupils equal, round and reactive to light, visual fields intact, fundi unremarkable. CN III, IV, VI:  full range of motion, no nystagmus, no ptosis CN V: facial sensation intact CN VII: upper and lower face symmetric CN VIII: hearing intact to finger rub CN IX, X: gag intact, uvula midline CN XI: sternocleidomastoid and trapezius muscles intact CN XII: tongue midline Bulk & Tone: normal, no fasciculations. Motor: 5/5 throughout with no pronator drift. Sensation: intact to light touch, cold, pin, vibration and joint position sense.  No extinction to double simultaneous stimulation.  Romberg  test negative Deep Tendon Reflexes: brisk +3 throughout with positive bilateral Hoffman's sign and hyperactive pectoralis reflexes, no ankle clonus Plantar responses: downgoing bilaterally Cerebellar: no incoordination on finger to nose testing Gait: narrow-based and steady, able to tandem walk adequately. Tremor: none  IMPRESSION: This is a 48 year old right-handed woman with a history of hypothyroidism, anxiety, and depression, presenting with a 3 month history of worsening right facial pain.  The etiology of her symptoms is unclear, she does not have the classic trigeminal neuralgia presentation.  She reports dizziness, right ear fullness and right eye blurring, in addition to pain on the right side of her face.  It is interesting that the excruciating pain abated with Amoxicillin, however she reports having evaluations by 2 dentists with xrays showing no evidence of infection.  Her neurological exam is non-focal, however there is note of hyperreflexia and abnormal reflexes in the upper extremities, concerning for upper motor neuron dysfunction.  An MRI brain and cervical spine with and without contrast will be ordered to assess for underlying structural abnormality such as compressive lesion or demyelination.  Check BMP, ESR and CRP. We discussed symptomatic treatment with gabapentin, she is very sensitive to medications and will start low dose 131m qhs with plans for uptitration as tolerated. Expectations and side effects of the medication were discussed.  She will follow-up after the tests.   Thank you for allowing me to participate in the care of this patient. Please do not hesitate to call for any questions or concerns.   KEllouise Newer M.D.

## 2013-06-16 NOTE — Patient Instructions (Signed)
1. MRI brain with and without contrast 2. MRI cervical spine with and without contrast 3. Start gabapentin 140m caps: Take 1 cap at bedtime for 2 weeks, may increase to 2 caps at bedtime 4. Refer to ENT  5. Bloodwork for BMP, ESR, CRP

## 2013-06-20 ENCOUNTER — Ambulatory Visit
Admission: RE | Admit: 2013-06-20 | Discharge: 2013-06-20 | Disposition: A | Discharge status: Home / Self Care | Payer: BC Managed Care – PPO | Source: Ambulatory Visit | Attending: Obstetrics and Gynecology | Admitting: Obstetrics and Gynecology

## 2013-06-20 DIAGNOSIS — N63 Unspecified lump in unspecified breast: Secondary | ICD-10-CM

## 2013-06-21 ENCOUNTER — Telehealth: Payer: Self-pay | Admitting: Neurology

## 2013-06-21 NOTE — Telephone Encounter (Signed)
Patient called for lab results. Call returned and results given

## 2013-06-21 NOTE — Telephone Encounter (Signed)
Please call pt [phone] would to knoe th eresults of the blood work

## 2013-06-22 NOTE — Telephone Encounter (Signed)
Pt also calling because she has not gotten her ENT appt scheduled yet. Please call [phone] / Sherri S.

## 2013-06-22 NOTE — Addendum Note (Signed)
Addended by: Samantha Crimes on: 06/22/2013 02:25 PM   Modules accepted: Orders

## 2013-06-22 NOTE — Telephone Encounter (Signed)
Spoke with patient informed her that the referral was made to [facility]. Once we get a time and date i will give her a call back. [facility] might call her first with time and date will continue to follow up in this

## 2013-06-28 NOTE — Telephone Encounter (Signed)
Patient notified of time and date of appointment with [facility]

## 2013-06-28 NOTE — Telephone Encounter (Signed)
Pt calling again, she still has not gotten an ENT appt scheduled. Please call back at [phone] / Sherri S.

## 2013-07-05 ENCOUNTER — Ambulatory Visit (HOSPITAL_COMMUNITY): Payer: BC Managed Care – PPO

## 2013-10-25 ENCOUNTER — Other Ambulatory Visit: Payer: Self-pay | Admitting: Obstetrics and Gynecology

## 2013-10-25 DIAGNOSIS — R1900 Intra-abdominal and pelvic swelling, mass and lump, unspecified site: Secondary | ICD-10-CM

## 2013-11-01 ENCOUNTER — Ambulatory Visit
Admission: RE | Admit: 2013-11-01 | Discharge: 2013-11-01 | Disposition: A | Discharge status: Home / Self Care | Payer: BC Managed Care – PPO | Source: Ambulatory Visit | Attending: Obstetrics and Gynecology | Admitting: Obstetrics and Gynecology

## 2013-11-01 DIAGNOSIS — R1900 Intra-abdominal and pelvic swelling, mass and lump, unspecified site: Secondary | ICD-10-CM

## 2013-11-01 MED ORDER — GADOBENATE DIMEGLUMINE 529 MG/ML IV SOLN
13.0000 mL | Freq: Once | INTRAVENOUS | Status: AC | PRN
  Administered 2013-11-01: 13 mL via INTRAVENOUS

## 2014-09-04 ENCOUNTER — Other Ambulatory Visit: Payer: Self-pay

## 2014-09-04 DIAGNOSIS — Z1231 Encounter for screening mammogram for malignant neoplasm of breast: Secondary | ICD-10-CM

## 2014-10-12 ENCOUNTER — Encounter: Payer: Self-pay | Admitting: Family Medicine

## 2014-10-12 ENCOUNTER — Telehealth: Payer: Self-pay | Admitting: Family Medicine

## 2014-10-12 ENCOUNTER — Ambulatory Visit (INDEPENDENT_AMBULATORY_CARE_PROVIDER_SITE_OTHER): Payer: 59 | Admitting: Family Medicine

## 2014-10-12 VITALS — BP 112/78 | HR 102 | Temp 99.1°F | Ht 68.0 in | Wt 126.0 lb

## 2014-10-12 DIAGNOSIS — F411 Generalized anxiety disorder: Secondary | ICD-10-CM

## 2014-10-12 DIAGNOSIS — F32A Depression, unspecified: Secondary | ICD-10-CM

## 2014-10-12 DIAGNOSIS — F329 Major depressive disorder, single episode, unspecified: Secondary | ICD-10-CM | POA: Diagnosis not present

## 2014-10-12 MED ORDER — LORAZEPAM 1 MG PO TABS
1.0000 mg | ORAL_TABLET | Freq: Four times a day (QID) | ORAL | Status: DC | PRN
Start: 2014-10-12 — End: 2015-01-12

## 2014-10-12 MED ORDER — BUPROPION HCL ER (XL) 150 MG PO TB24: 150.0000 mg | ORAL_TABLET | Freq: Every day | ORAL | Status: DC

## 2014-10-12 NOTE — Progress Notes (Signed)
Pre visit review using our clinic review tool, if applicable. No additional management support is needed unless otherwise documented below in the visit note. 

## 2014-10-12 NOTE — Telephone Encounter (Signed)
Patient Name: Nancy Choi  DOB: 27-Aug-1965    Initial Comment Caller states husband has had a transplant, she states she is a nervous wreck, needs something to help her   Nurse Assessment  Nurse: Sherilyn Cooter, RN, Thurmond Butts Date/Time (Eastern Time): 10/12/2014 10:26:38 AM  Confirm and document reason for call. If symptomatic, describe symptoms. ---Caller states that her husband had a transplant on 09/19/14. He had stem cell transplant. His body is not "grafting" at this point. She is a Government social research officer. She has difficulty breathing at times.  Has the patient traveled out of the country within the last 30 days? ---No  Does the patient require triage? ---Yes  Related visit to physician within the last 2 weeks? ---No  Does the PT have any chronic conditions? (i.e. diabetes, asthma, etc.) ---Yes  List chronic conditions. ---Hypothyroidism, Depression  Did the patient indicate they were pregnant? ---No     Guidelines    Guideline Title Affirmed Question Affirmed Notes  Anxiety and Panic Attack Symptoms interfere with work or school    Final Disposition User   See Physician within 24 Hours Sherilyn Cooter, RN, Thurmond Butts    Comments  Appointment scheduled for 3:45pm with Dr. Kriste Basque today.   Referrals  REFERRED TO PCP OFFICE   Disagree/Comply: Comply

## 2014-10-15 ENCOUNTER — Encounter: Payer: Self-pay | Admitting: Family Medicine

## 2014-10-15 NOTE — Progress Notes (Signed)
   Subjective:    Patient ID: Nancy Choi, female    DOB: 16-Jan-1965, 49 y.o.   MRN: [id number]  HPI Here asking for help with anxiety and depression. Her husband has serious health issues including cancer, and he has been in the hospital for awhile now. She has been ceyr upset, worried, sad, and cannot relax. She has trouble eating and sleeping. Se has been on Effexor XR for years and she has been trying to wean herself off it. She is down to 37.5 mg daily and cannot get past that level.   Review of Systems  Constitutional: Negative.   Respiratory: Negative.   Cardiovascular: Negative.   Neurological: Negative.   Psychiatric/Behavioral: Positive for sleep disturbance, dysphoric mood and decreased concentration. Negative for suicidal ideas, hallucinations, behavioral problems, confusion, self-injury and agitation. The patient is nervous/anxious.        Objective:   Physical Exam  Constitutional: She is oriented to person, place, and time. She appears well-developed and well-nourished.  Neurological: She is alert and oriented to person, place, and time.  Psychiatric: Her behavior is normal. Judgment and thought content normal.  She is anxious and tearful           Assessment & Plan:  Anxiety with depression. She will stay on Effexor but we will add Wellbutrin XL 150 mg daily and Lorazepam to use as needed. Recheck in 3-4 weeks. We spent 35 minutes face to face discussing these issues.

## 2014-10-16 ENCOUNTER — Ambulatory Visit: Payer: Self-pay

## 2014-10-18 ENCOUNTER — Ambulatory Visit (INDEPENDENT_AMBULATORY_CARE_PROVIDER_SITE_OTHER): Payer: 59 | Admitting: Family Medicine

## 2014-10-18 VITALS — BP 120/80 | HR 92 | Temp 98.2°F | Wt 127.4 lb

## 2014-10-18 DIAGNOSIS — F4323 Adjustment disorder with mixed anxiety and depressed mood: Secondary | ICD-10-CM

## 2014-10-18 MED ORDER — BUPROPION HCL ER (XL) 300 MG PO TB24: 300.0000 mg | ORAL_TABLET | Freq: Every day | ORAL | Status: DC

## 2014-10-18 NOTE — Progress Notes (Signed)
Pre visit review using our clinic review tool, if applicable. No additional management support is needed unless otherwise documented below in the visit note. 

## 2014-10-18 NOTE — Progress Notes (Signed)
   Subjective:    Patient ID: Nancy Choi, female    DOB: [birth year], 49 y.o.   MRN: [id number]  HPI Patient is here to follow-up regarding depression issues. Her husband had recent stem cell transplant at [facility] and has not done very well. She's had tremendous difficulties dealing with this. She has past history of depression and has worsened over the past several weeks. She has both anxiety and depression. No suicidal ideation. She is getting regular counseling through the [facility] where he is being treated. She is accompanied by friend from work today. She has had difficulty sleeping but this has been improved with recent lorazepam which she takes at night. She has been for several years on Effexor XR 37.5 mg daily but had difficulty tapering off. She had addition last week of Wellbutrin 150 mg is tolerating without side effects. She does think this is starting to help but still having significant crying spells and depressed mood. She's had somewhat diminished appetite.  She has hypothyroidism which is managed elsewhere and she states she had normal TSH back in August.  Past Medical History  Diagnosis Date  . HYPOTHYROIDISM 08/14/2008    Qualifier: Diagnosis of  By: Gabriel Rung LPN, Harriett Sine    . Generalized anxiety disorder 08/14/2008    Qualifier: Diagnosis of  By: Gabriel Rung LPN, Harriett Sine    . DEPRESSION 08/14/2008    Qualifier: Diagnosis of  By: Gabriel Rung LPN, Harriett Sine     No past surgical history on file.  reports that she has never smoked. She has never used smokeless tobacco. She reports that she does not drink alcohol or use illicit drugs. family history includes Cancer in her mother; Depression in her mother; Diabetes in her paternal grandmother; Hyperlipidemia in her father. Allergies  Allergen Reactions  . Codeine Sulfate     REACTION: irritable, bugs crawling on skin sensation      Review of Systems  Constitutional: Positive for appetite change and fatigue. Negative for fever and  chills.  Respiratory: Negative for shortness of breath.   Cardiovascular: Negative for chest pain.  Neurological: Negative for dizziness.  Psychiatric/Behavioral: Positive for sleep disturbance and dysphoric mood. Negative for suicidal ideas, confusion and agitation. The patient is nervous/anxious.        Objective:   Physical Exam  Constitutional: She is oriented to person, place, and time. She appears well-developed and well-nourished. No distress.  Cardiovascular: Normal rate and regular rhythm.   Pulmonary/Chest: Effort normal and breath sounds normal. No respiratory distress. She has no wheezes. She has no rales.  Neurological: She is alert and oriented to person, place, and time.  Psychiatric: Judgment and thought content normal.  Patient crying intermittently during interview. She is alert and appropriate interaction otherwise          Assessment & Plan:  Adjustment disorder with mixed anxiety and depression. We spent over 25 minutes counseling patient this time was spent assessing her depression and counseling her regarding medications, potential side effects, counseling services available, and potential time out of work. She is having great difficulty focusing at work right now and we have agreed to medical leave from October 10 through November 13. Work note written. Increase Wellbutrin XL to 300 mg once daily. Continue as needed lorazepam at night for sleep. Continue counseling. Reassess approximate 4 weeks

## 2014-11-19 ENCOUNTER — Encounter: Payer: Self-pay | Admitting: Family Medicine

## 2014-11-19 ENCOUNTER — Ambulatory Visit (INDEPENDENT_AMBULATORY_CARE_PROVIDER_SITE_OTHER): Payer: 59 | Admitting: Family Medicine

## 2014-11-19 VITALS — BP 120/84 | HR 103 | Temp 98.5°F | Wt 123.0 lb

## 2014-11-19 DIAGNOSIS — Z23 Encounter for immunization: Secondary | ICD-10-CM

## 2014-11-19 DIAGNOSIS — F329 Major depressive disorder, single episode, unspecified: Secondary | ICD-10-CM

## 2014-11-19 DIAGNOSIS — F32A Depression, unspecified: Secondary | ICD-10-CM

## 2014-11-19 NOTE — Progress Notes (Signed)
   Subjective:    Patient ID: Nancy Choi, female    DOB: 10/17/1965, 49 y.o.   MRN: [id number]  HPI Patient seen for follow-up regarding depression. Her husband remains in intensive care unit at [facility] with multiple issues. She continues to have issues regarding that. She feels that Wellbutrin has not made much difference in her mood. She is reluctant to take additional medications.Was out ideation. She continue to have some difficulty sleeping at night but low-dose lorazepam has helped. She is getting regular counseling. She has extended her work leave through December 11. She does not feel capable of focusing or concentrating at this time secondary to depression and anxiety.  Past Medical History  Diagnosis Date  . HYPOTHYROIDISM 08/14/2008    Qualifier: Diagnosis of  By: Gabriel RungNeckers LPN, Harriett SineNancy    . Generalized anxiety disorder 08/14/2008    Qualifier: Diagnosis of  By: Gabriel RungNeckers LPN, Harriett SineNancy    . DEPRESSION 08/14/2008    Qualifier: Diagnosis of  By: Gabriel RungNeckers LPN, Harriett SineNancy     No past surgical history on file.  reports that she has never smoked. She has never used smokeless tobacco. She reports that she does not drink alcohol or use illicit drugs. family history includes Cancer in her mother; Depression in her mother; Diabetes in her paternal grandmother; Hyperlipidemia in her father. Allergies  Allergen Reactions  . Codeine Sulfate     REACTION: irritable, bugs crawling on skin sensation      Review of Systems  Respiratory: Negative for shortness of breath.   Cardiovascular: Negative for chest pain.  Psychiatric/Behavioral: Positive for sleep disturbance and dysphoric mood. Negative for suicidal ideas. The patient is nervous/anxious.        Objective:   Physical Exam  Constitutional: She appears well-developed and well-nourished.  Cardiovascular: Normal rate and regular rhythm.   Pulmonary/Chest: Effort normal and breath sounds normal. No respiratory distress. She has no wheezes.  She has no rales.  Psychiatric: Judgment and thought content normal.  Patient slightly anxious. She makes excellent eye contact and appropriate behavior          Assessment & Plan:  Adjustment disorder with depressed mood. Continue Wellbutrin. She is reluctant to consider additional medication. We discussed possibility of adding SSRI but she is reluctant. Continue regular counseling. Work absence extended through December 11. Reassess one month. We discussed adding some high calorie snack supplements to prevent further weight loss. She will try to get back to some regular exercise

## 2014-11-19 NOTE — Patient Instructions (Signed)
Major Depressive Disorder Major depressive disorder is a mental illness. It also may be called clinical depression or unipolar depression. Major depressive disorder usually causes feelings of sadness, hopelessness, or helplessness. Some people with this disorder do not feel particularly sad but lose interest in doing things they used to enjoy (anhedonia). Major depressive disorder also can cause physical symptoms. It can interfere with work, school, relationships, and other normal everyday activities. The disorder varies in severity but is longer lasting and more serious than the sadness we all feel from time to time in our lives. Major depressive disorder often is triggered by stressful life events or major life changes. Examples of these triggers include divorce, loss of your job or home, a move, and the death of a family member or close friend. Sometimes this disorder occurs for no obvious reason at all. People who have family members with major depressive disorder or bipolar disorder are at higher risk for developing this disorder, with or without life stressors. Major depressive disorder can occur at any age. It may occur just once in your life (single episode major depressive disorder). It may occur multiple times (recurrent major depressive disorder). SYMPTOMS People with major depressive disorder have either anhedonia or depressed mood on nearly a daily basis for at least 2 weeks or longer. Symptoms of depressed mood include:  Feelings of sadness (blue or down in the dumps) or emptiness.  Feelings of hopelessness or helplessness.  Tearfulness or episodes of crying (may be observed by others).  Irritability (children and adolescents). In addition to depressed mood or anhedonia or both, people with this disorder have at least four of the following symptoms:  Difficulty sleeping or sleeping too much.   Significant change (increase or decrease) in appetite or weight.   Lack of energy or  motivation.  Feelings of guilt and worthlessness.   Difficulty concentrating, remembering, or making decisions.  Unusually slow movement (psychomotor retardation) or restlessness (as observed by others).   Recurrent wishes for death, recurrent thoughts of self-harm (suicide), or a suicide attempt. People with major depressive disorder commonly have persistent negative thoughts about themselves, other people, and the world. People with severe major depressive disorder may experiencedistorted beliefs or perceptions about the world (psychotic delusions). They also may see or hear things that are not real (psychotic hallucinations). DIAGNOSIS Major depressive disorder is diagnosed through an assessment by your health care provider. Your health care provider will ask aboutaspects of your daily life, such as mood,sleep, and appetite, to see if you have the diagnostic symptoms of major depressive disorder. Your health care provider may ask about your medical history and use of alcohol or drugs, including prescription medicines. Your health care provider also may do a physical exam and blood work. This is because certain medical conditions and the use of certain substances can cause major depressive disorder-like symptoms (secondary depression). Your health care provider also may refer you to a mental health specialist for further evaluation and treatment. TREATMENT It is important to recognize the symptoms of major depressive disorder and seek treatment. The following treatments can be prescribed for this disorder:   Medicine. Antidepressant medicines usually are prescribed. Antidepressant medicines are thought to correct chemical imbalances in the brain that are commonly associated with major depressive disorder. Other types of medicine may be added if the symptoms do not respond to antidepressant medicines alone or if psychotic delusions or hallucinations occur.  Talk therapy. Talk therapy can be  helpful in treating major depressive disorder by providing   support, education, and guidance. Certain types of talk therapy also can help with negative thinking (cognitive behavioral therapy) and with relationship issues that trigger this disorder (interpersonal therapy). A mental health specialist can help determine which treatment is best for you. Most people with major depressive disorder do well with a combination of medicine and talk therapy. Treatments involving electrical stimulation of the brain can be used in situations with extremely severe symptoms or when medicine and talk therapy do not work over time. These treatments include electroconvulsive therapy, transcranial magnetic stimulation, and vagal nerve stimulation.   This information is not intended to replace advice given to you by your health care provider. Make sure you discuss any questions you have with your health care provider.   Document Released: 04/25/2012 Document Revised: 01/19/2014 Document Reviewed: 04/25/2012 Elsevier Interactive Patient Education 2016 Elsevier Inc.  

## 2014-11-19 NOTE — Progress Notes (Signed)
Pre visit review using our clinic review tool, if applicable. No additional management support is needed unless otherwise documented below in the visit note. 

## 2014-12-04 ENCOUNTER — Other Ambulatory Visit: Payer: Self-pay | Admitting: Family Medicine

## 2014-12-04 NOTE — Telephone Encounter (Signed)
Dr. Clent RidgesFry started pt on this medication (Lorazepam) on 10/12/2014 #60. He saw her for an acute visit discussing her anxiety and depression sx. Please advise if you want to continue this medication for her?

## 2014-12-05 NOTE — Telephone Encounter (Signed)
Refill once for #30.  Try to taper back and use just at night prn (if possible).

## 2014-12-05 NOTE — Telephone Encounter (Signed)
Rx called in for #30 with no refills. Instructions included to take prn at night only if possible

## 2015-01-12 ENCOUNTER — Other Ambulatory Visit: Payer: Self-pay | Admitting: Family Medicine

## 2015-03-18 ENCOUNTER — Other Ambulatory Visit: Payer: Self-pay | Admitting: *Deleted

## 2015-03-18 MED ORDER — BUPROPION HCL ER (XL) 300 MG PO TB24: 300.0000 mg | ORAL_TABLET | Freq: Every day | ORAL | Status: DC

## 2015-03-20 ENCOUNTER — Telehealth: Payer: Self-pay | Admitting: Family Medicine

## 2015-03-20 MED ORDER — BUPROPION HCL ER (XL) 150 MG PO TB24: 150.0000 mg | ORAL_TABLET | Freq: Every day | ORAL | Status: DC

## 2015-03-20 NOTE — Telephone Encounter (Signed)
Okay to sent wellbutrin 150 ?

## 2015-03-20 NOTE — Telephone Encounter (Signed)
Pt request refill of the following: buPROPion (WELLBUTRIN XL) 300 MG 24 hr tablet   Pt call to say that she had ween herself off of the 300 and is now taking 150 mg and that is what she need called in       Phamacy:

## 2015-03-20 NOTE — Telephone Encounter (Signed)
OK to refill the 150 mg dose with three additional refills.

## 2015-03-20 NOTE — Telephone Encounter (Signed)
rx sent

## 2015-04-12 ENCOUNTER — Ambulatory Visit: Payer: 59 | Admitting: Gynecology

## 2015-08-19 ENCOUNTER — Other Ambulatory Visit: Payer: Self-pay | Admitting: Family Medicine

## 2015-12-18 ENCOUNTER — Other Ambulatory Visit: Payer: Self-pay | Admitting: Family Medicine

## 2016-01-20 ENCOUNTER — Other Ambulatory Visit: Payer: Self-pay | Admitting: Family Medicine

## 2016-02-04 ENCOUNTER — Ambulatory Visit (INDEPENDENT_AMBULATORY_CARE_PROVIDER_SITE_OTHER): Payer: 59 | Admitting: Family Medicine

## 2016-02-04 VITALS — BP 90/64 | HR 89 | Ht 68.0 in | Wt 120.9 lb

## 2016-02-04 DIAGNOSIS — F411 Generalized anxiety disorder: Secondary | ICD-10-CM | POA: Diagnosis not present

## 2016-02-04 DIAGNOSIS — F329 Major depressive disorder, single episode, unspecified: Secondary | ICD-10-CM | POA: Diagnosis not present

## 2016-02-04 DIAGNOSIS — F32A Depression, unspecified: Secondary | ICD-10-CM

## 2016-02-04 MED ORDER — BUPROPION HCL ER (XL) 150 MG PO TB24: 150.0000 mg | ORAL_TABLET | Freq: Every day | ORAL | 3 refills | Status: DC

## 2016-02-04 NOTE — Progress Notes (Signed)
Subjective:     Patient ID: Nancy Choi, female   DOB: 09/06/1965, 51 y.o.   MRN: [id number]  HPI Patient seen requesting medication refill. She has history of depression, anxiety, hypothyroidism. She is followed by GYN and they are following her TSH. She has history of low bone density. She exercises regularly. Her husband passed away a little over year ago. This has been a very difficult year for her. She has good support. She feels her depression symptoms are stable on low-dose Effexor and Wellbutrin. Requesting refills of Wellbutrin. Her appetite is stable. She states she's gained about 6 pounds over recent months.  Past Medical History:  Diagnosis Date  . DEPRESSION 08/14/2008   Qualifier: Diagnosis of  By: Gabriel RungNeckers LPN, Harriett SineNancy    . Generalized anxiety disorder 08/14/2008   Qualifier: Diagnosis of  By: Gabriel RungNeckers LPN, Harriett SineNancy    . HYPOTHYROIDISM 08/14/2008   Qualifier: Diagnosis of  By: Gabriel RungNeckers LPN, Harriett SineNancy     No past surgical history on file.  reports that she has never smoked. She has never used smokeless tobacco. She reports that she does not drink alcohol or use drugs. family history includes Cancer in her mother; Depression in her mother; Diabetes in her paternal grandmother; Hyperlipidemia in her father. Allergies  Allergen Reactions  . Codeine Sulfate     REACTION: irritable, bugs crawling on skin sensation     Review of Systems  Constitutional: Negative for appetite change and unexpected weight change.  Respiratory: Negative for cough.   Cardiovascular: Negative for chest pain.  Gastrointestinal: Negative for abdominal pain.  Psychiatric/Behavioral: Positive for dysphoric mood. Negative for agitation and suicidal ideas.       Objective:   Physical Exam  Constitutional: She appears well-developed and well-nourished.  Cardiovascular: Normal rate and regular rhythm.   Pulmonary/Chest: Effort normal and breath sounds normal. No respiratory distress. She has no wheezes. She has no  rales.  Musculoskeletal: She exhibits no edema.  Psychiatric: She has a normal mood and affect. Her behavior is normal. Judgment and thought content normal.       Assessment:     Reactive depression and anxiety    Plan:     -Refilled Wellbutrin. She feels the current regimen of low-dose Effexor and Wellbutrin is working well for her. -She is encouraged to continue close follow-up with GYN regarding her TSH  Kristian CoveyBruce W Rusell Meneely MD [facility]

## 2016-02-04 NOTE — Progress Notes (Signed)
Pre visit review using our clinic review tool, if applicable. No additional management support is needed unless otherwise documented below in the visit note. 

## 2017-02-04 ENCOUNTER — Other Ambulatory Visit: Payer: Self-pay | Admitting: Family Medicine

## 2017-03-17 ENCOUNTER — Other Ambulatory Visit: Payer: Self-pay | Admitting: Family Medicine

## 2017-05-17 ENCOUNTER — Ambulatory Visit (INDEPENDENT_AMBULATORY_CARE_PROVIDER_SITE_OTHER): Payer: 59 | Admitting: Family Medicine

## 2017-05-17 ENCOUNTER — Encounter: Payer: Self-pay | Admitting: Family Medicine

## 2017-05-17 VITALS — BP 100/68 | HR 80 | Temp 98.0°F | Wt 123.1 lb

## 2017-05-17 DIAGNOSIS — M19041 Primary osteoarthritis, right hand: Secondary | ICD-10-CM

## 2017-05-17 DIAGNOSIS — M19042 Primary osteoarthritis, left hand: Secondary | ICD-10-CM

## 2017-05-17 DIAGNOSIS — M5431 Sciatica, right side: Secondary | ICD-10-CM | POA: Diagnosis not present

## 2017-05-17 NOTE — Patient Instructions (Signed)
Sciatica Sciatica is pain, numbness, weakness, or tingling along the path of the sciatic nerve. The sciatic nerve starts in the lower back and runs down the back of each leg. The nerve controls the muscles in the lower leg and in the back of the knee. It also provides feeling (sensation) to the back of the thigh, the lower leg, and the sole of the foot. Sciatica is a symptom of another medical condition that pinches or puts pressure on the sciatic nerve. Generally, sciatica only affects one side of the body. Sciatica usually goes away on its own or with treatment. In some cases, sciatica may keep coming back (recur). What are the causes? This condition is caused by pressure on the sciatic nerve, or pinching of the sciatic nerve. This may be the result of:  A disk in between the bones of the spine (vertebrae) bulging out too far (herniated disk).  Age-related changes in the spinal disks (degenerative disk disease).  A pain disorder that affects a muscle in the buttock (piriformis syndrome).  Extra bone growth (bone spur) near the sciatic nerve.  An injury or break (fracture) of the pelvis.  Pregnancy.  Tumor (rare).  What increases the risk? The following factors may make you more likely to develop this condition:  Playing sports that place pressure or stress on the spine, such as football or weight lifting.  Having poor strength and flexibility.  A history of back injury.  A history of back surgery.  Sitting for long periods of time.  Doing activities that involve repetitive bending or lifting.  Obesity.  What are the signs or symptoms? Symptoms can vary from mild to very severe, and they may include:  Any of these problems in the lower back, leg, hip, or buttock: ? Mild tingling or dull aches. ? Burning sensations. ? Sharp pains.  Numbness in the back of the calf or the sole of the foot.  Leg weakness.  Severe back pain that makes movement difficult.  These  symptoms may get worse when you cough, sneeze, or laugh, or when you sit or stand for long periods of time. Being overweight may also make symptoms worse. In some cases, symptoms may recur over time. How is this diagnosed? This condition may be diagnosed based on:  Your symptoms.  A physical exam. Your health care provider may ask you to do certain movements to check whether those movements trigger your symptoms.  You may have tests, including: ? Blood tests. ? X-rays. ? MRI. ? CT scan.  How is this treated? In many cases, this condition improves on its own, without any treatment. However, treatment may include:  Reducing or modifying physical activity during periods of pain.  Exercising and stretching to strengthen your abdomen and improve the flexibility of your spine.  Icing and applying heat to the affected area.  Medicines that help: ? To relieve pain and swelling. ? To relax your muscles.  Injections of medicines that help to relieve pain, irritation, and inflammation around the sciatic nerve (steroids).  Surgery.  Follow these instructions at home: Medicines  Take over-the-counter and prescription medicines only as told by your health care provider.  Do not drive or operate heavy machinery while taking prescription pain medicine. Managing pain  If directed, apply ice to the affected area. ? Put ice in a plastic bag. ? Place a towel between your skin and the bag. ? Leave the ice on for 20 minutes, 2-3 times a day.  After icing, apply   heat to the affected area before you exercise or as often as told by your health care provider. Use the heat source that your health care provider recommends, such as a moist heat pack or a heating pad. ? Place a towel between your skin and the heat source. ? Leave the heat on for 20-30 minutes. ? Remove the heat if your skin turns bright red. This is especially important if you are unable to feel pain, heat, or cold. You may have a  greater risk of getting burned. Activity  Return to your normal activities as told by your health care provider. Ask your health care provider what activities are safe for you. ? Avoid activities that make your symptoms worse.  Take brief periods of rest throughout the day. Resting in a lying or standing position is usually better than sitting to rest. ? When you rest for longer periods, mix in some mild activity or stretching between periods of rest. This will help to prevent stiffness and pain. ? Avoid sitting for long periods of time without moving. Get up and move around at least one time each hour.  Exercise and stretch regularly, as told by your health care provider.  Do not lift anything that is heavier than 10 lb (4.5 kg) while you have symptoms of sciatica. When you do not have symptoms, you should still avoid heavy lifting, especially repetitive heavy lifting.  When you lift objects, always use proper lifting technique, which includes: ? Bending your knees. ? Keeping the load close to your body. ? Avoiding twisting. General instructions  Use good posture. ? Avoid leaning forward while sitting. ? Avoid hunching over while standing.  Maintain a healthy weight. Excess weight puts extra stress on your back and makes it difficult to maintain good posture.  Wear supportive, comfortable shoes. Avoid wearing high heels.  Avoid sleeping on a mattress that is too soft or too hard. A mattress that is firm enough to support your back when you sleep may help to reduce your pain.  Keep all follow-up visits as told by your health care provider. This is important. Contact a health care provider if:  You have pain that wakes you up when you are sleeping.  You have pain that gets worse when you lie down.  Your pain is worse than you have experienced in the past.  Your pain lasts longer than 4 weeks.  You experience unexplained weight loss. Get help right away if:  You lose control  of your bowel or bladder (incontinence).  You have: ? Weakness in your lower back, pelvis, buttocks, or legs that gets worse. ? Redness or swelling of your back. ? A burning sensation when you urinate. This information is not intended to replace advice given to you by your health care provider. Make sure you discuss any questions you have with your health care provider. Document Released: 12/23/2000 Document Revised: 06/04/2015 Document Reviewed: 09/07/2014 Elsevier Interactive Patient Education  2018 Elsevier Inc.  

## 2017-05-17 NOTE — Progress Notes (Signed)
  Subjective:     Patient ID: Nancy Choi, female   DOB: 1965/05/11, 52 y.o.   MRN: [id number]  HPI Patient seen with somewhat progressive pains radiating from her right lower lumbar area down to the buttock posterior lateral down to the foot. She states she's had some issues for at least 5 or 6 years but especially progressive over recent months. Her pain is worse with prolonged sitting and also sometimes at night. She still been able to exercise some and generally tolerating walking okay. She has occasional numbness in the right thigh but no weakness. No urine or stool incontinence. No appetite or weight changes.  She's taken Aleve and some type of topical patch (?Lidocaine) which is providing some relief. She's tried multiple stretches without much improvement. She states that years ago she was seen by orthopedist and plain x-rays which were fairly unremarkable. She's never had MRI of her lumbar spine  Also complains of some arthritis type pains especially involving both thumbs CMC and CMP joints and also PIP and DIP joints of both hands. Occasional stiffness. No warmth or erythema  Past Medical History:  Diagnosis Date  . DEPRESSION 08/14/2008   Qualifier: Diagnosis of  By: Gabriel Rung LPN, Harriett Sine    . Generalized anxiety disorder 08/14/2008   Qualifier: Diagnosis of  By: Gabriel Rung LPN, Harriett Sine    . HYPOTHYROIDISM 08/14/2008   Qualifier: Diagnosis of  By: Gabriel Rung LPN, Harriett Sine     No past surgical history on file.  reports that she has never smoked. She has never used smokeless tobacco. She reports that she does not drink alcohol or use drugs. family history includes Cancer in her mother; Depression in her mother; Diabetes in her paternal grandmother; Hyperlipidemia in her father. Allergies  Allergen Reactions  . Codeine Sulfate     REACTION: irritable, bugs crawling on skin sensation     Review of Systems  Cardiovascular: Negative for chest pain.  Gastrointestinal: Negative for abdominal pain.   Musculoskeletal: Positive for back pain.  Neurological: Positive for numbness. Negative for weakness.       Objective:   Physical Exam  Constitutional: She appears well-developed and well-nourished.  Cardiovascular: Normal rate and regular rhythm.  Pulmonary/Chest: Effort normal and breath sounds normal.  Musculoskeletal:  Straight leg raises are negative bilaterally  Neurological:  2+ reflexes knee and ankle bilaterally. She has full strength with flexion, dorsiflexion, knee extension bilaterally       Assessment:     #1 Right-sided sciatica symptoms progressive over several years. Nonfocal neuro exam. Not responsive to conservative therapies including nonsteroidals and topical patch along with stretches  #2 osteoarthritis thumbs/hands.    Plan:     -Set up MRI lumbosacral spine and further assess -she is not requiring additional medications for pain control at this time.  Kristian Covey MD [facility] at [facility]

## 2017-06-04 ENCOUNTER — Ambulatory Visit
Admission: RE | Admit: 2017-06-04 | Discharge: 2017-06-04 | Disposition: A | Discharge status: Home / Self Care | Payer: 59 | Source: Ambulatory Visit | Attending: Family Medicine | Admitting: Family Medicine

## 2017-06-04 DIAGNOSIS — M5431 Sciatica, right side: Secondary | ICD-10-CM

## 2017-06-10 ENCOUNTER — Other Ambulatory Visit: Payer: Self-pay | Admitting: Family Medicine

## 2017-06-10 ENCOUNTER — Telehealth: Payer: Self-pay | Admitting: Family Medicine

## 2017-06-10 DIAGNOSIS — M5431 Sciatica, right side: Secondary | ICD-10-CM

## 2017-06-10 NOTE — Telephone Encounter (Signed)
Copied from CRM [id number]. Topic: Quick Communication - Other Results >> Jun 10, 2017 12:43 PM Oneal Grout wrote: Requesting MRI results

## 2017-06-10 NOTE — Telephone Encounter (Signed)
Patient is aware of results.

## 2017-06-14 ENCOUNTER — Other Ambulatory Visit: Payer: Self-pay | Admitting: Family Medicine

## 2017-06-25 ENCOUNTER — Ambulatory Visit: Payer: 59 | Admitting: Family Medicine

## 2017-09-13 ENCOUNTER — Other Ambulatory Visit: Payer: Self-pay | Admitting: Family Medicine

## 2017-10-14 ENCOUNTER — Ambulatory Visit (INDEPENDENT_AMBULATORY_CARE_PROVIDER_SITE_OTHER): Payer: 59 | Admitting: Family Medicine

## 2017-10-14 ENCOUNTER — Encounter: Payer: Self-pay | Admitting: Family Medicine

## 2017-10-14 VITALS — BP 112/70 | HR 82 | Temp 98.6°F | Ht 68.0 in | Wt 120.6 lb

## 2017-10-14 DIAGNOSIS — J329 Chronic sinusitis, unspecified: Secondary | ICD-10-CM | POA: Diagnosis not present

## 2017-10-14 DIAGNOSIS — J040 Acute laryngitis: Secondary | ICD-10-CM

## 2017-10-14 DIAGNOSIS — J310 Chronic rhinitis: Secondary | ICD-10-CM

## 2017-10-14 MED ORDER — AMOXICILLIN-POT CLAVULANATE 875-125 MG PO TABS: 1.0000 | ORAL_TABLET | Freq: Two times a day (BID) | ORAL | 0 refills | Status: DC

## 2017-10-14 NOTE — Patient Instructions (Addendum)
BEFORE YOU LEAVE: -follow up: CPE in next 1-3 months  Nasal saline twice daily  Flonase 2 sprays each nostril daily for 2-3 weeks  Take the Augmentin if worsening or if symptoms are not improving over the next several days.  I hope you are feeling better soon! Seek care promptly if your symptoms worsen, new concerns arise or you are not improving with treatment.  Amoxicillin; Clavulanic Acid chewable tablets What is this medicine? AMOXICILLIN; CLAVULANIC ACID (a mox i SIL in; KLAV yoo lan ic AS id) is a penicillin antibiotic. It is used to treat certain kinds of bacterial infections. It It will not work for colds, flu, or other viral infections. This medicine may be used for other purposes; ask your health care provider or pharmacist if you have questions. COMMON BRAND NAME(S): Augmentin What should I tell my health care provider before I take this medicine? They need to know if you have any of these conditions: -bowel disease, like colitis -kidney disease -liver disease -mononucleosis -phenylketonuria -an unusual or allergic reaction to amoxicillin, penicillin, cephalosporin, other antibiotics, clavulanic acid, other medicines, foods, dyes, or preservatives -pregnant or trying to get pregnant -breast-feeding How should I use this medicine? Take this medicine by mouth. Chew it completely before swallowing. Follow the directions on the prescription label. Take this medicine at the start of a meal or snack. Take your medicine at regular intervals. Do not take your medicine more often than directed. Take all of your medicine as directed even if you think you are better. Do not skip doses or stop your medicine early. Talk to your pediatrician regarding the use of this medicine in children. While this drug may be prescribed for selected conditions, precautions do apply. Overdosage: If you think you have taken too much of this medicine contact a poison control center or emergency room at  once. NOTE: This medicine is only for you. Do not share this medicine with others. What if I miss a dose? If you miss a dose, take it as soon as you can. If it is almost time for your next dose, take only that dose. Do not take double or extra doses. What may interact with this medicine? -allopurinol -anticoagulants -birth control pills -methotrexate -probenecid This list may not describe all possible interactions. Give your health care provider a list of all the medicines, herbs, non-prescription drugs, or dietary supplements you use. Also tell them if you smoke, drink alcohol, or use illegal drugs. Some items may interact with your medicine. What should I watch for while using this medicine? Tell your doctor or health care professional if your symptoms do not improve. Do not treat diarrhea with over the counter products. Contact your doctor if you have diarrhea that lasts more than 2 days or if it is severe and watery. If you have diabetes, you may get a false-positive result for sugar in your urine. Check with your doctor or health care professional. Birth control pills may not work properly while you are taking this medicine. Talk to your doctor about using an extra method of birth control. What side effects may I notice from receiving this medicine? Side effects that you should report to your doctor or health care professional as soon as possible: -allergic reactions like skin rash, itching or hives, swelling of the face, lips, or tongue -breathing problems -dark urine -fever or chills, sore throat -redness, blistering, peeling or loosening of the skin, including inside the mouth -seizures -trouble passing urine or change in the  amount of urine -unusual bleeding, bruising -unusually weak or tired -white patches or sores in the mouth or throat Side effects that usually do not require medical attention (report to your doctor or health care professional if they continue or are  bothersome): -diarrhea -dizziness -headache -nausea, vomiting -stomach upset -vaginal or anal irritation This list may not describe all possible side effects. Call your doctor for medical advice about side effects. You may report side effects to FDA at 1-800-FDA-1088. Where should I keep my medicine? Keep out of the reach of children. Store at room temperature below 25 degrees C (77 degrees F). Keep container tightly closed. Throw away any unused medicine after the expiration date. NOTE: This sheet is a summary. It may not cover all possible information. If you have questions about this medicine, talk to your doctor, pharmacist, or health care provider.  2018 Elsevier/Gold Standard (2007-03-24 11:38:22)

## 2017-10-14 NOTE — Progress Notes (Signed)
HPI:  Using dictation device. Unfortunately this device frequently misinterprets words/phrases.   Acute visit for respiratory illness: -started: about 2 weeks ago -symptoms:thick PND, sore throat, cough, laryngitis, nose feels full, some mild fever initially and nausea initially now resolved -denies:fever, SOB, NVD, tooth pain -has tried: claritin -sick contacts/travel/risks: no reported flu, strep or tick exposure  ROS: See pertinent positives and negatives per HPI.  Past Medical History:  Diagnosis Date  . DEPRESSION 08/14/2008   Qualifier: Diagnosis of  By: Gabriel Rung LPN, Harriett Sine    . Generalized anxiety disorder 08/14/2008   Qualifier: Diagnosis of  By: Gabriel Rung LPN, Harriett Sine    . HYPOTHYROIDISM 08/14/2008   Qualifier: Diagnosis of  By: Gabriel Rung LPN, Harriett Sine      History reviewed. No pertinent surgical history.  Family History  Problem Relation Age of Onset  . Cancer Mother        breast  . Depression Mother   . Hyperlipidemia Father   . Diabetes Paternal Grandmother     Social History   Socioeconomic History  . Marital status: Married    Spouse name: Not on file  . Number of children: Not on file  . Years of education: Not on file  . Highest education level: Not on file  Occupational History  . Not on file  Social Needs  . Financial resource strain: Not on file  . Food insecurity:    Worry: Not on file    Inability: Not on file  . Transportation needs:    Medical: Not on file    Non-medical: Not on file  Tobacco Use  . Smoking status: Never Smoker  . Smokeless tobacco: Never Used  Substance and Sexual Activity  . Alcohol use: No    Alcohol/week: 0.0 standard drinks  . Drug use: No  . Sexual activity: Not on file  Lifestyle  . Physical activity:    Days per week: Not on file    Minutes per session: Not on file  . Stress: Not on file  Relationships  . Social connections:    Talks on phone: Not on file    Gets together: Not on file    Attends religious  service: Not on file    Active member of club or organization: Not on file    Attends meetings of clubs or organizations: Not on file    Relationship status: Not on file  Other Topics Concern  . Not on file  Social History Narrative  . Not on file     Current Outpatient Medications:  .  ARMOUR THYROID 60 MG tablet, Takes 60mg  in the morning and 30mg  at night., Disp: , Rfl:  .  Ascorbic Acid (VITAMIN C) 1000 MG tablet, Take 1,000 mg by mouth daily., Disp: , Rfl:  .  buPROPion (WELLBUTRIN XL) 150 MG 24 hr tablet, TAKE ONE TABLET EVERY DAY, Disp: 90 tablet, Rfl: 0 .  Cholecalciferol (VITAMIN D) 2000 UNITS CAPS, Take by mouth., Disp: , Rfl:  .  LORazepam (ATIVAN) 1 MG tablet, take 1 tablet by mouth at bedtime if needed, Disp: 30 tablet, Rfl: 0 .  progesterone (PROMETRIUM) 100 MG capsule, Take 150 mg by mouth daily. , Disp: , Rfl:  .  VENLAFAXINE HCL PO, Take 12.5 mg by mouth daily., Disp: , Rfl:  .  vitamin E 100 UNIT capsule, Take 100 Units by mouth daily., Disp: , Rfl:  .  amoxicillin-clavulanate (AUGMENTIN) 875-125 MG tablet, Take 1 tablet by mouth 2 (two) times daily., Disp: 20  tablet, Rfl: 0  EXAM:  Vitals:   10/14/17 1105  BP: 112/70  Pulse: 82  Temp: 98.6 F (37 C)  SpO2: 99%    Body mass index is 18.34 kg/m.  GENERAL: vitals reviewed and listed above, alert, oriented, appears well hydrated and in no acute distress  HEENT: atraumatic, conjunttiva clear, no obvious abnormalities on inspection of external nose and ears, normal appearance of ear canals and TMs except for clear effusion R, thick nasal congestion R, mild post oropharyngeal erythema with PND, no tonsillar edema or exudate, no sinus TTP  NECK: no obvious masses on inspection  LUNGS: clear to auscultation bilaterally, no wheezes, rales or rhonchi, good air movement  CV: HRRR, no peripheral edema  MS: moves all extremities without noticeable abnormality  PSYCH: pleasant and cooperative, no obvious depression  or anxiety  ASSESSMENT AND PLAN:  Discussed the following assessment and plan:  Rhinosinusitis  Laryngitis  -given HPI and exam findings today, a serious infection or illness is unlikely. We discussed potential etiologies, with AR vs VURI being most likely - potential developing sinusitis. We discussed treatment side effects, likely course, antibiotic misuse, transmission, and signs of developing a serious illness. -opted for trial nasal saline and adding INS, antibiotic if worsening or not improving -to follow up if symptoms don't resolve entirely with treatment -of course, we advised to follow up promptly  if symptoms worsen or new concerns arise.    Patient Instructions  BEFORE YOU LEAVE: -follow up: CPE in next 1-3 months  Nasal saline twice daily  Flonase 2 sprays each nostril daily for 2-3 weeks  Take the Augmentin if worsening or if symptoms are not improving over the next several days.  I hope you are feeling better soon! Seek care promptly if your symptoms worsen, new concerns arise or you are not improving with treatment.  Amoxicillin; Clavulanic Acid chewable tablets What is this medicine? AMOXICILLIN; CLAVULANIC ACID (a mox i SIL in; KLAV yoo lan ic AS id) is a penicillin antibiotic. It is used to treat certain kinds of bacterial infections. It It will not work for colds, flu, or other viral infections. This medicine may be used for other purposes; ask your health care provider or pharmacist if you have questions. COMMON BRAND NAME(S): Augmentin What should I tell my health care provider before I take this medicine? They need to know if you have any of these conditions: -bowel disease, like colitis -kidney disease -liver disease -mononucleosis -phenylketonuria -an unusual or allergic reaction to amoxicillin, penicillin, cephalosporin, other antibiotics, clavulanic acid, other medicines, foods, dyes, or preservatives -pregnant or trying to get  pregnant -breast-feeding How should I use this medicine? Take this medicine by mouth. Chew it completely before swallowing. Follow the directions on the prescription label. Take this medicine at the start of a meal or snack. Take your medicine at regular intervals. Do not take your medicine more often than directed. Take all of your medicine as directed even if you think you are better. Do not skip doses or stop your medicine early. Talk to your pediatrician regarding the use of this medicine in children. While this drug may be prescribed for selected conditions, precautions do apply. Overdosage: If you think you have taken too much of this medicine contact a poison control center or emergency room at once. NOTE: This medicine is only for you. Do not share this medicine with others. What if I miss a dose? If you miss a dose, take it as soon as you  can. If it is almost time for your next dose, take only that dose. Do not take double or extra doses. What may interact with this medicine? -allopurinol -anticoagulants -birth control pills -methotrexate -probenecid This list may not describe all possible interactions. Give your health care provider a list of all the medicines, herbs, non-prescription drugs, or dietary supplements you use. Also tell them if you smoke, drink alcohol, or use illegal drugs. Some items may interact with your medicine. What should I watch for while using this medicine? Tell your doctor or health care professional if your symptoms do not improve. Do not treat diarrhea with over the counter products. Contact your doctor if you have diarrhea that lasts more than 2 days or if it is severe and watery. If you have diabetes, you may get a false-positive result for sugar in your urine. Check with your doctor or health care professional. Birth control pills may not work properly while you are taking this medicine. Talk to your doctor about using an extra method of birth  control. What side effects may I notice from receiving this medicine? Side effects that you should report to your doctor or health care professional as soon as possible: -allergic reactions like skin rash, itching or hives, swelling of the face, lips, or tongue -breathing problems -dark urine -fever or chills, sore throat -redness, blistering, peeling or loosening of the skin, including inside the mouth -seizures -trouble passing urine or change in the amount of urine -unusual bleeding, bruising -unusually weak or tired -white patches or sores in the mouth or throat Side effects that usually do not require medical attention (report to your doctor or health care professional if they continue or are bothersome): -diarrhea -dizziness -headache -nausea, vomiting -stomach upset -vaginal or anal irritation This list may not describe all possible side effects. Call your doctor for medical advice about side effects. You may report side effects to FDA at 1-800-FDA-1088. Where should I keep my medicine? Keep out of the reach of children. Store at room temperature below 25 degrees C (77 degrees F). Keep container tightly closed. Throw away any unused medicine after the expiration date. NOTE: This sheet is a summary. It may not cover all possible information. If you have questions about this medicine, talk to your doctor, pharmacist, or health care provider.  2018 Elsevier/Gold Standard (2007-03-24 11:38:22)    Terressa Koyanagi, DO

## 2017-11-30 ENCOUNTER — Other Ambulatory Visit: Payer: Self-pay | Admitting: Obstetrics and Gynecology

## 2017-11-30 DIAGNOSIS — Z803 Family history of malignant neoplasm of breast: Secondary | ICD-10-CM

## 2017-12-07 ENCOUNTER — Other Ambulatory Visit: Payer: Self-pay | Admitting: Family Medicine

## 2018-01-24 ENCOUNTER — Ambulatory Visit: Payer: Self-pay | Admitting: *Deleted

## 2018-01-24 NOTE — Telephone Encounter (Signed)
Pt reports cough, onset Thursday, 01/20/2018. States productive for clear phlegm.Pt is taking Robitussin.  Reports "Very achy" over weekend. States unsure if febrile but did have chills alternating with sweating. Pt states aching has resolved with Aleve. Reports this AM severe nausea, no vomiting, along with one episode of dizziness. States "Spinning" only occurrence this am.  Reports nausea has resolved, mild lightheadedness remains. States is staying hydrated. Denies ear ache, sinus pressure. Appt made for tomorrow am with Dr. Caryl Never. Care advise given per protocol. Pt states she feels much better since Thursday and if continues to improve, she will call and cancel appt.  Reason for Disposition . Cough with cold symptoms (e.g., runny nose, postnasal drip, throat clearing) . [1] Continuous (nonstop) coughing interferes with work or school AND [2] no improvement using cough treatment per Care Advice  Answer Assessment - Initial Assessment Questions 1. ONSET: "When did the cough begin?"      Thursday 2. SEVERITY: "How bad is the cough today?"      Bad at night 3. RESPIRATORY DISTRESS: "Describe your breathing."      WNL 4. FEVER: "Do you have a fever?" If so, ask: "What is your temperature, how was it measured, and when did it start?"     Unsure, did have chills friday 5. SPUTUM: "Describe the color of your sputum" (clear, white, yellow, green)     Clear 6. HEMOPTYSIS: "Are you coughing up any blood?" If so ask: "How much?" (flecks, streaks, tablespoons, etc.)     no 7. CARDIAC HISTORY: "Do you have any history of heart disease?" (e.g., heart attack, congestive heart failure)      8. LUNG HISTORY: "Do you have any history of lung disease?"  (e.g., pulmonary embolus, asthma, emphysema)     9. PE RISK FACTORS: "Do you have a history of blood clots?" (or: recent major surgery, recent prolonged travel, bedridden)      10. OTHER SYMPTOMS: "Do you have any other symptoms?" (e.g., runny nose,  wheezing, chest pain)      Chest tightness, onset Thursday, mild  Protocols used: COUGH - ACUTE PRODUCTIVE-A-AH

## 2018-01-25 ENCOUNTER — Ambulatory Visit: Payer: 59 | Admitting: Family Medicine

## 2018-03-01 ENCOUNTER — Other Ambulatory Visit: Payer: 59

## 2018-03-09 ENCOUNTER — Other Ambulatory Visit: Payer: 59

## 2018-04-08 ENCOUNTER — Encounter: Payer: 59 | Admitting: Family Medicine

## 2018-05-18 ENCOUNTER — Encounter: Payer: Self-pay | Admitting: Family Medicine

## 2018-05-23 NOTE — Telephone Encounter (Signed)
I would gladly schedule for her brother. I just need her brothers information to contact him to make an appointment.

## 2018-05-23 NOTE — Telephone Encounter (Signed)
I would gladly schedule an appointment I just need the brothers information.  Thank you

## 2018-06-07 NOTE — Telephone Encounter (Signed)
Pt called to reschedule her appt on 5.27.20 to Friday so that she can come in office. Please call Pt to schedule

## 2018-06-08 ENCOUNTER — Encounter: Payer: 59 | Admitting: Family Medicine

## 2018-07-01 ENCOUNTER — Encounter: Payer: 59 | Admitting: Family Medicine

## 2018-07-08 ENCOUNTER — Encounter: Payer: Self-pay | Admitting: Family Medicine

## 2018-07-08 ENCOUNTER — Other Ambulatory Visit: Payer: Self-pay

## 2018-07-08 ENCOUNTER — Ambulatory Visit (INDEPENDENT_AMBULATORY_CARE_PROVIDER_SITE_OTHER): Payer: 59 | Admitting: Family Medicine

## 2018-07-08 VITALS — BP 112/60 | HR 78 | Temp 98.3°F | Ht 68.0 in | Wt 119.1 lb

## 2018-07-08 DIAGNOSIS — Z23 Encounter for immunization: Secondary | ICD-10-CM

## 2018-07-08 DIAGNOSIS — Z0000 Encounter for general adult medical examination without abnormal findings: Secondary | ICD-10-CM | POA: Diagnosis not present

## 2018-07-08 LAB — HEPATIC FUNCTION PANEL
ALT: 12 U/L (ref 0–35)
AST: 19 U/L (ref 0–37)
Albumin: 4.2 g/dL (ref 3.5–5.2)
Alkaline Phosphatase: 85 U/L (ref 39–117)
Bilirubin, Direct: 0.1 mg/dL (ref 0.0–0.3)
Total Bilirubin: 0.6 mg/dL (ref 0.2–1.2)
Total Protein: 6.7 g/dL (ref 6.0–8.3)

## 2018-07-08 LAB — BASIC METABOLIC PANEL
BUN: 17 mg/dL (ref 6–23)
CO2: 29 mEq/L (ref 19–32)
Calcium: 9.2 mg/dL (ref 8.4–10.5)
Chloride: 103 mEq/L (ref 96–112)
Creatinine, Ser: 0.67 mg/dL (ref 0.40–1.20)
GFR: 91.87 mL/min (ref >60.00)
Glucose, Bld: 84 mg/dL (ref 70–99)
Potassium: 4 mEq/L (ref 3.5–5.1)
Sodium: 140 mEq/L (ref 135–145)

## 2018-07-08 LAB — CBC WITH DIFFERENTIAL/PLATELET
Basophils Absolute: 0.1 10*3/uL (ref 0.0–0.1)
Basophils Relative: 0.6 % (ref 0.0–3.0)
Eosinophils Absolute: 0.1 10*3/uL (ref 0.0–0.7)
Eosinophils Relative: 0.6 % (ref 0.0–5.0)
HCT: 41.5 % (ref 36.0–46.0)
Hemoglobin: 13.7 g/dL (ref 12.0–15.0)
Lymphocytes Relative: 25 % (ref 12.0–46.0)
Lymphs Abs: 2.4 10*3/uL (ref 0.7–4.0)
MCHC: 32.9 g/dL (ref 30.0–36.0)
MCV: 92 fl (ref 78.0–100.0)
Monocytes Absolute: 0.6 10*3/uL (ref 0.1–1.0)
Monocytes Relative: 5.9 % (ref 3.0–12.0)
Neutro Abs: 6.5 10*3/uL (ref 1.4–7.7)
Neutrophils Relative %: 67.9 % (ref 43.0–77.0)
Platelets: 233 10*3/uL (ref 150.0–400.0)
RBC: 4.51 Mil/uL (ref 3.87–5.11)
RDW: 13.1 % (ref 11.5–15.5)
WBC: 9.5 10*3/uL (ref 4.0–10.5)

## 2018-07-08 LAB — LIPID PANEL
Cholesterol: 198 mg/dL (ref 0–200)
HDL: 91.1 mg/dL (ref >39.00)
LDL Cholesterol: 97 mg/dL (ref 0–99)
NonHDL: 107.12
Total CHOL/HDL Ratio: 2
Triglycerides: 52 mg/dL (ref 0.0–149.0)
VLDL: 10.4 mg/dL (ref 0.0–40.0)

## 2018-07-08 LAB — TSH: TSH: <0.01 u[IU]/mL — ABNORMAL LOW (ref 0.35–4.50)

## 2018-07-08 MED ORDER — BUPROPION HCL ER (XL) 300 MG PO TB24: 300.0000 mg | ORAL_TABLET | Freq: Every day | ORAL | 3 refills | Status: DC

## 2018-07-08 NOTE — Progress Notes (Signed)
Subjective:     Patient ID: Nancy Choi, female   DOB: 06/14/1965, 53 y.o.   MRN: [id number]  HPI Patient seen for physical exam.  She has forms that need to be completed for work.  She sees gynecologist yearly.  She is up-to-date with mammograms and Pap smears.  She gets yearly mammogram.  She has not had screening colonoscopy.  Her tetanus is been over 10 years.  She has continued to battle some depression issues.  Her husband died couple years ago.  She is currently on Wellbutrin XL 150 mg daily.  She is tried to taper off Effexor and currently down to 12.5 mg.  She would like to consider further titration of Wellbutrin.  Denies suicidal ideation.  She is exercising 5 days a week 1 hour/day.  She does have history of osteopenia.  Never smoked  Past Medical History:  Diagnosis Date  . DEPRESSION 08/14/2008   Qualifier: Diagnosis of  By: Valma Cava LPN, Izora Gala    . Generalized anxiety disorder 08/14/2008   Qualifier: Diagnosis of  By: Valma Cava LPN, Izora Gala    . HYPOTHYROIDISM 08/14/2008   Qualifier: Diagnosis of  By: Valma Cava LPN, Izora Gala     History reviewed. No pertinent surgical history.  reports that she has never smoked. She has never used smokeless tobacco. She reports that she does not drink alcohol or use drugs. family history includes Cancer in her mother; Depression in her mother; Diabetes in her paternal grandmother; Hyperlipidemia in her father. Allergies  Allergen Reactions  . Codeine Sulfate     REACTION: irritable, bugs crawling on skin sensation     Review of Systems  Constitutional: Negative for activity change, appetite change, fatigue, fever and unexpected weight change.  HENT: Negative for ear pain, hearing loss, sore throat and trouble swallowing.   Eyes: Negative for visual disturbance.  Respiratory: Negative for cough and shortness of breath.   Cardiovascular: Negative for chest pain and palpitations.  Gastrointestinal: Negative for abdominal pain, blood in stool,  constipation and diarrhea.  Genitourinary: Negative for dysuria and hematuria.  Musculoskeletal: Negative for arthralgias, back pain and myalgias.  Skin: Negative for rash.  Neurological: Negative for dizziness, syncope and headaches.  Hematological: Negative for adenopathy.  Psychiatric/Behavioral: Positive for dysphoric mood. Negative for confusion and suicidal ideas.       Objective:   Physical Exam Constitutional:      Appearance: She is well-developed.  HENT:     Head: Normocephalic and atraumatic.     Comments: Right ear canal is impacted with cerumen Eyes:     Pupils: Pupils are equal, round, and reactive to light.  Neck:     Musculoskeletal: Normal range of motion and neck supple.     Thyroid: No thyromegaly.  Cardiovascular:     Rate and Rhythm: Normal rate and regular rhythm.     Heart sounds: Normal heart sounds. No murmur.  Pulmonary:     Effort: No respiratory distress.     Breath sounds: Normal breath sounds. No wheezing or rales.  Abdominal:     General: Bowel sounds are normal. There is no distension.     Palpations: Abdomen is soft. There is no mass.     Tenderness: There is no abdominal tenderness. There is no guarding or rebound.  Musculoskeletal: Normal range of motion.     Right lower leg: No edema.     Left lower leg: No edema.  Lymphadenopathy:     Cervical: No cervical adenopathy.  Skin:  Findings: No rash.  Neurological:     Mental Status: She is alert and oriented to person, place, and time.     Cranial Nerves: No cranial nerve deficit.     Deep Tendon Reflexes: Reflexes normal.  Psychiatric:        Behavior: Behavior normal.        Thought Content: Thought content normal.        Judgment: Judgment normal.        Assessment:     Physical exam.  Generally healthy 53 year old female.  She is had some ongoing depression issues.  We discussed the following health maintenance items    Plan:     -Set up initial screening colonoscopy -She  will continue with yearly GYN follow-up -Irrigate right ear -Tdap -Obtain screening lab work -Continue yearly flu vaccine -Continue with regular weightbearing exercise -Increase Wellbutrin XL to 300 mg daily and give us some feedback in a couple of weeks if not improving  Kristian CoveyBruce W Freddi Forster MD [facility]

## 2018-07-11 ENCOUNTER — Encounter: Payer: Self-pay | Admitting: Family Medicine

## 2018-08-01 ENCOUNTER — Other Ambulatory Visit: Payer: Self-pay

## 2018-08-01 ENCOUNTER — Telehealth: Payer: Self-pay | Admitting: *Deleted

## 2018-08-01 NOTE — Telephone Encounter (Signed)
Patient was called at 67 am and 1035 am for [facility] nurse phone visit. No answer, left message for her to call us back. Will try again later.

## 2018-08-01 NOTE — Telephone Encounter (Signed)
Spoke with patient. rescheduled PV phone call on 7/22 at 230 pm and explained that we will call her at that date/time.

## 2018-08-03 ENCOUNTER — Other Ambulatory Visit: Payer: Self-pay

## 2018-08-03 ENCOUNTER — Ambulatory Visit: Payer: 59 | Admitting: *Deleted

## 2018-08-03 VITALS — Ht 68.0 in | Wt 121.0 lb

## 2018-08-03 DIAGNOSIS — Z1211 Encounter for screening for malignant neoplasm of colon: Secondary | ICD-10-CM

## 2018-08-03 MED ORDER — SUPREP BOWEL PREP KIT 17.5-3.13-1.6 GM/177ML PO SOLN: 1.0000 | Freq: Once | ORAL | 0 refills | Status: AC

## 2018-08-03 NOTE — Progress Notes (Signed)
PREVISIT  VIA TELEPHONE.ID PER NAME,DOB AND ADDRESS No egg or soy allergy known to patient  No issues with past sedation with any surgeries  or procedures, no intubation problems  No diet pills per patient No home 02 use per patient  No blood thinners per patient  Pt denies issues with constipation  No A fib or A flutter  EMMI information,consent, suprep coupon, acknowledgement form and envelope, and instructions mailed to the patient.

## 2018-08-12 ENCOUNTER — Telehealth: Payer: Self-pay | Admitting: Gastroenterology

## 2018-08-12 NOTE — Telephone Encounter (Signed)
Pt responded no to all screening questions, but pt reported that she failed to read instructions and has eaten nuts and taken Metamucil.

## 2018-08-12 NOTE — Telephone Encounter (Signed)
LM on vmail to call back for patient regarding Covid-19 screening questions. Covid-19 Screening Questions:   Do you now or have you had a fever in the last 14 days?    Do you have any respiratory symptoms of shortness of breath or cough now or in the last 14 days?    Do you have any family members or close contacts with diagnosed or suspected Covid-19 in the past 14 days?    Have you been tested for Covid-19 and found to be positive?

## 2018-08-15 ENCOUNTER — Ambulatory Visit (AMBULATORY_SURGERY_CENTER): Payer: 59 | Admitting: Gastroenterology

## 2018-08-15 ENCOUNTER — Encounter: Payer: Self-pay | Admitting: Gastroenterology

## 2018-08-15 ENCOUNTER — Other Ambulatory Visit: Payer: Self-pay

## 2018-08-15 VITALS — BP 100/58 | HR 65 | Temp 97.8°F | Resp 12

## 2018-08-15 DIAGNOSIS — Z1211 Encounter for screening for malignant neoplasm of colon: Secondary | ICD-10-CM | POA: Diagnosis present

## 2018-08-15 MED ORDER — SODIUM CHLORIDE 0.9 % IV SOLN: 500.0000 mL | Freq: Once | INTRAVENOUS | Status: DC

## 2018-08-15 NOTE — Progress Notes (Signed)
Report given to PACU, vss 

## 2018-08-15 NOTE — Progress Notes (Signed)
Nancy Choi temp/ Nancy Choi. vitalsPt's states no medical or surgical changes since previsit or office visit.

## 2018-08-15 NOTE — Op Note (Signed)
[facility] Patient Name: Nancy Choi Procedure Date: 08/15/2018 8:00 AM MRN: [id number] Endoscopist: Meryl DareMalcolm T Malic Rosten , MD Age: 53 Referring MD:  Date of Birth: 07/12/1965 Gender: Female Account #: [id number] Procedure:                Colonoscopy Indications:              Screening for colorectal malignant neoplasm Medicines:                Monitored Anesthesia Care Procedure:                Pre-Anesthesia Assessment:                           - Prior to the procedure, a History and Physical                            was performed, and patient medications and                            allergies were reviewed. The patient's tolerance of                            previous anesthesia was also reviewed. The risks                            and benefits of the procedure and the sedation                            options and risks were discussed with the patient.                            All questions were answered, and informed consent                            was obtained. Prior Anticoagulants: The patient has                            taken no previous anticoagulant or antiplatelet                            agents. ASA Grade Assessment: II - A patient with                            mild systemic disease. After reviewing the risks                            and benefits, the patient was deemed in                            satisfactory condition to undergo the procedure.                           After obtaining informed consent, the colonoscope  was passed under direct vision. Throughout the                            procedure, the patient's blood pressure, pulse, and                            oxygen saturations were monitored continuously. The                            Colonoscope was introduced through the anus and                            advanced to the the cecum, identified by                            appendiceal orifice and  ileocecal valve. The                            ileocecal valve, appendiceal orifice, and rectum                            were photographed. The quality of the bowel                            preparation was adequate to identify polyps 6 mm                            and larger in size despiste extensive lavage and                            suctioning. The patient tolerated the procedure                            well. The colonoscopy was somewhat difficult due to                            somewhat limited endoscopic visualization, a                            redundant colon and a tortuous colon. Scope In: 8:04:28 AM Scope Out: 8:27:40 AM Scope Withdrawal Time: 0 hours 15 minutes 53 seconds  Total Procedure Duration: 0 hours 23 minutes 12 seconds  Findings:                 The perianal and digital rectal examinations were                            normal.                           The entire examined colon appeared normal on direct                            and retroflexion views. Complications:  No immediate complications. Estimated blood loss:                            None. Estimated Blood Loss:     Estimated blood loss: none. Impression:               - The entire examined colon is normal on direct and                            retroflexion views.                           - No specimens collected. Recommendation:           - Repeat colonoscopy in 5 years for screening                            purposes with a more extensive bowel prep.                           - Patient has a contact number available for                            emergencies. The signs and symptoms of potential                            delayed complications were discussed with the                            patient. Return to normal activities tomorrow.                            Written discharge instructions were provided to the                            patient.                            - Resume previous diet.                           - Continue present medications. Meryl DareMalcolm T Lorry Furber, MD 08/15/2018 8:31:01 AM This report has been signed electronically.

## 2018-08-15 NOTE — Patient Instructions (Signed)
YOU HAD AN ENDOSCOPIC PROCEDURE TODAY AT THE [facility]:   Refer to the procedure report that was given to you for any specific questions about what was found during the examination.  If the procedure report does not answer your questions, please call your gastroenterologist to clarify.  If you requested that your care partner not be given the details of your procedure findings, then the procedure report has been included in a sealed envelope for you to review at your convenience later.  YOU SHOULD EXPECT: Some feelings of bloating in the abdomen. Passage of more gas than usual.  Walking can help get rid of the air that was put into your GI tract during the procedure and reduce the bloating. If you had a lower endoscopy (such as a colonoscopy or flexible sigmoidoscopy) you may notice spotting of blood in your stool or on the toilet paper. If you underwent a bowel prep for your procedure, you may not have a normal bowel movement for a few days.  Please Note:  You might notice some irritation and congestion in your nose or some drainage.  This is from the oxygen used during your procedure.  There is no need for concern and it should clear up in a day or so.  SYMPTOMS TO REPORT IMMEDIATELY:   Following lower endoscopy (colonoscopy or flexible sigmoidoscopy):  Excessive amounts of blood in the stool  Significant tenderness or worsening of abdominal pains  Swelling of the abdomen that is new, acute  Fever of 100F or higher  For urgent or emergent issues, a gastroenterologist can be reached at any hour by calling [phone].   DIET:  We do recommend a small meal at first, but then you may proceed to your regular diet.  Drink plenty of fluids but you should avoid alcoholic beverages for 24 hours.  ACTIVITY:  You should plan to take it easy for the rest of today and you should NOT DRIVE or use heavy machinery until tomorrow (because of the sedation medicines used during the test).     FOLLOW UP: Our staff will call the number listed on your records 48-72 hours following your procedure to check on you and address any questions or concerns that you may have regarding the information given to you following your procedure. If we do not reach you, we will leave a message.  We will attempt to reach you two times.  During this call, we will ask if you have developed any symptoms of COVID 19. If you develop any symptoms (ie: fever, flu-like symptoms, shortness of breath, cough etc.) before then, please call [phone].  If you test positive for Covid 19 in the 2 weeks post procedure, please call and report this information to us.    If any biopsies were taken you will be contacted by phone or by letter within the next 1-3 weeks.  Please call us at [phone] if you have not heard about the biopsies in 3 weeks.    SIGNATURES/CONFIDENTIALITY: You and/or your care partner have signed paperwork which will be entered into your electronic medical record.  These signatures attest to the fact that that the information above on your After Visit Summary has been reviewed and is understood.  Full responsibility of the confidentiality of this discharge information lies with you and/or your care-partner. 

## 2018-08-17 ENCOUNTER — Telehealth: Payer: Self-pay | Admitting: *Deleted

## 2018-08-17 NOTE — Telephone Encounter (Signed)
First follow up call attempt.  Reached voicemail. Message left to call if any questions/concerns regarding procedure or COVID -19.

## 2018-08-17 NOTE — Telephone Encounter (Signed)
1. Have you developed a fever since your procedure? no  2.   Have you had an respiratory symptoms (SOB or cough) since your procedure? no  3.   Have you tested positive for COVID 19 since your procedure no  4.   Have you had any family members/close contacts diagnosed with the COVID 19 since your procedure?  no   If yes to any of these questions please route to Joylene John, RN and Alphonsa Gin, Therapist, sports.  Follow up Call-  Call back number 08/15/2018  Post procedure Call Back phone  # [phone]  Permission to leave phone message Yes  Some recent data might be hidden     Patient questions:  Do you have a fever, pain , or abdominal swelling? No. Pain Score  0 *  Have you tolerated food without any problems? Yes.    Have you been able to return to your normal activities? Yes.    Do you have any questions about your discharge instructions: Diet   No. Medications  No. Follow up visit  No.  Do you have questions or concerns about your Care? No.  Actions: * If pain score is 4 or above: No action needed, pain <4.

## 2018-10-06 ENCOUNTER — Other Ambulatory Visit: Payer: Self-pay | Admitting: Obstetrics and Gynecology

## 2018-10-06 DIAGNOSIS — Z9189 Other specified personal risk factors, not elsewhere classified: Secondary | ICD-10-CM

## 2018-10-24 ENCOUNTER — Other Ambulatory Visit: Payer: Self-pay

## 2018-10-24 ENCOUNTER — Ambulatory Visit
Admission: RE | Admit: 2018-10-24 | Discharge: 2018-10-24 | Disposition: A | Discharge status: Home / Self Care | Payer: 59 | Source: Ambulatory Visit | Attending: Obstetrics and Gynecology | Admitting: Obstetrics and Gynecology

## 2018-10-24 DIAGNOSIS — Z9189 Other specified personal risk factors, not elsewhere classified: Secondary | ICD-10-CM

## 2018-10-24 MED ORDER — GADOBUTROL 1 MMOL/ML IV SOLN
5.0000 mL | Freq: Once | INTRAVENOUS | Status: AC | PRN
  Administered 2018-10-24: 5 mL via INTRAVENOUS

## 2018-11-13 ENCOUNTER — Encounter: Payer: Self-pay | Admitting: *Deleted

## 2018-11-13 ENCOUNTER — Other Ambulatory Visit (HOSPITAL_COMMUNITY)
Admission: RE | Admit: 2018-11-13 | Discharge: 2018-11-13 | Disposition: A | Discharge status: Home / Self Care | Payer: 59 | Source: Ambulatory Visit | Attending: Family Medicine | Admitting: Family Medicine

## 2018-11-13 ENCOUNTER — Other Ambulatory Visit: Payer: Self-pay

## 2018-11-13 ENCOUNTER — Emergency Department (INDEPENDENT_AMBULATORY_CARE_PROVIDER_SITE_OTHER)
Admission: EM | Admit: 2018-11-13 | Discharge: 2018-11-13 | Disposition: A | Discharge status: Home / Self Care | Payer: 59 | Source: Home / Self Care

## 2018-11-13 DIAGNOSIS — N760 Acute vaginitis: Secondary | ICD-10-CM | POA: Insufficient documentation

## 2018-11-13 DIAGNOSIS — R300 Dysuria: Secondary | ICD-10-CM | POA: Diagnosis present

## 2018-11-13 LAB — POCT URINALYSIS DIP (MANUAL ENTRY)
Bilirubin, UA: NEGATIVE
Glucose, UA: NEGATIVE mg/dL
Ketones, POC UA: NEGATIVE mg/dL
Nitrite, UA: NEGATIVE
Protein Ur, POC: NEGATIVE mg/dL
Spec Grav, UA: 1.015 (ref 1.010–1.025)
Urobilinogen, UA: 0.2 E.U./dL
pH, UA: 8.5 — AB (ref 5.0–8.0)

## 2018-11-13 MED ORDER — BENZOCAINE-RESORCINOL 5-2 % VA CREA: TOPICAL_CREAM | Freq: Every day | VAGINAL | 0 refills | Status: DC

## 2018-11-13 MED ORDER — PHENAZOPYRIDINE HCL 200 MG PO TABS: 200.0000 mg | ORAL_TABLET | Freq: Three times a day (TID) | ORAL | 0 refills | Status: DC

## 2018-11-13 MED ORDER — FLUCONAZOLE 150 MG PO TABS: 150.0000 mg | ORAL_TABLET | Freq: Once | ORAL | 0 refills | Status: AC

## 2018-11-13 NOTE — ED Triage Notes (Signed)
Patient c/o 1 days of vaginal burning and urinary frequency and urgency. She took an epsom salt bath last night and had intercourse 2 days ago. Took 1 Cystex today without relief.

## 2018-11-13 NOTE — Discharge Instructions (Signed)
°  You will be notified in about 2-3 days of test results if any additional treatment is needed.  Please follow up with your family doctor or GYN next week if not improving or if symptoms improve but quickly return.

## 2018-11-13 NOTE — ED Provider Notes (Signed)
[facility]    CSN: [id number] Arrival date & time: 11/13/18  0844      History   Chief Complaint Chief Complaint  Patient presents with  . Vaginal Pain    burning  . Urinary Frequency    HPI Nancy Choi is a 53 y.o. female.   HPI  Nancy Choi is a 53 y.o. female presenting to UC with c/o 1 day of vaginal burning, urinary frequency and urinary urgency.  She does report taking more frequent Epson salt baths recently due to sore leg muscles from working in her yard.  She does not know if this has contributed to her current symptoms.  She does report having intercourse 2 days ago as well but denies concern for STIs. She had a UTI about 1 month ago, was on macrobid and symptoms resolved at that time. She was prescribed diflucan in case she developed a yeast infection but she never took the medication.  She did take Cystex once this morning w/o relief.  Hx of yeast infections in the past but usually has more itching.  Denies fever, chills, n/v/d.   Past Medical History:  Diagnosis Date  . Allergy   . DEPRESSION 08/14/2008   Qualifier: Diagnosis of  By: Valma Cava LPN, Izora Gala    . Generalized anxiety disorder 08/14/2008   Qualifier: Diagnosis of  By: Valma Cava LPN, Izora Gala    . HYPOTHYROIDISM 08/14/2008   Qualifier: Diagnosis of  By: Valma Cava LPN, Izora Gala      Patient Active Problem List   Diagnosis Date Noted  . Facial pain 06/16/2013  . HYPOTHYROIDISM 08/14/2008  . GENERALIZED ANXIETY DISORDER 08/14/2008  . Depression 08/14/2008    Past Surgical History:  Procedure Laterality Date  . BREAST SURGERY      OB History   No obstetric history on file.      Home Medications    Prior to Admission medications   Medication Sig Start Date End Date Taking? Authorizing Provider  ARMOUR THYROID 60 MG tablet Takes 60mg  in the morning and 60 mg at night. 04/08/11  Yes [provider]  Ascorbic Acid (VITAMIN C) 1000 MG tablet Take 1,000 mg by mouth daily.   Yes  [provider]  buPROPion (WELLBUTRIN XL) 300 MG 24 hr tablet Take 1 tablet (300 mg total) by mouth daily. Patient taking differently: Take 150 mg by mouth daily.  07/08/18  Yes Burchette, Alinda Sierras, MD  Cholecalciferol (VITAMIN D) 2000 UNITS CAPS Take by mouth 3 (three) times a week.    Yes [provider]  estradiol (ESTRACE) 1 MG tablet TAKE ONE AND A HALF TABLET BY MOUTH EVERY DAY 07/04/18  Yes [provider]  INTRAROSA 6.5 MG INST INSERT 1 APPLICATORFUL INTRAVAGINALLY AT BEDTIME NIGHTLY. 07/04/18  Yes [provider]  progesterone (PROMETRIUM) 100 MG capsule Take 150 mg by mouth daily.  04/20/11  Yes [provider]  Testosterone 2 MG/24HR PT24 Place onto the skin.   Yes [provider]  VENLAFAXINE HCL PO Take 12.5 mg by mouth daily.   Yes [provider]  vitamin E 100 UNIT capsule Take 100 Units by mouth daily.   Yes [provider]  benzocaine-resorcinol (VAGISIL) 5-2 % vaginal cream Place vaginally at bedtime. 11/13/18   Noe Gens, PA-C  fluconazole (DIFLUCAN) 150 MG tablet Take 1 tablet (150 mg total) by mouth once for 1 dose. 11/13/18 11/13/18  Noe Gens, PA-C  phenazopyridine (PYRIDIUM) 200 MG tablet Take 1 tablet (  200 mg total) by mouth 3 (three) times daily. 11/13/18   Lurene ShadowPhelps, Josaphine Shimamoto O, PA-C    Family History Family History  Problem Relation Age of Onset  . Cancer Mother        breast  . Depression Mother   . Hyperlipidemia Father   . Diabetes Paternal Grandmother   . Stomach cancer Neg Hx   . Rectal cancer Neg Hx   . Esophageal cancer Neg Hx   . Colon polyps Neg Hx   . Colon cancer Neg Hx     Social History Social History   Tobacco Use  . Smoking status: Never Smoker  . Smokeless tobacco: Never Used  Substance Use Topics  . Alcohol use: No    Alcohol/week: 0.0 standard drinks  . Drug use: No     Allergies   Codeine sulfate   Review of Systems Review of Systems  Constitutional:  Negative for chills and fever.  Gastrointestinal: Negative for abdominal pain, diarrhea, nausea and vomiting.  Genitourinary: Positive for dysuria, frequency and vaginal pain ( burning). Negative for decreased urine volume, flank pain, genital sores, hematuria, pelvic pain, urgency, vaginal bleeding and vaginal discharge.  Musculoskeletal: Negative for back pain.  Neurological: Negative for dizziness and headaches.     Physical Exam Triage Vital Signs ED Triage Vitals  Enc Vitals Group     BP 11/13/18 0901 109/81     Pulse Rate 11/13/18 0901 76     Resp 11/13/18 0901 16     Temp 11/13/18 0901 98.2 F (36.8 C)     Temp Source 11/13/18 0901 Oral     SpO2 11/13/18 0901 100 %     Weight --      Height --      Head Circumference --      Peak Flow --      Pain Score 11/13/18 0910 8     Pain Loc --      Pain Edu? --      Excl. in GC? --    No data found.  Updated Vital Signs BP 109/81 (BP Location: Right Arm)   Pulse 76   Temp 98.2 F (36.8 C) (Oral)   Resp 16   LMP 10/06/2013   SpO2 100%   Visual Acuity Right Eye Distance:   Left Eye Distance:   Bilateral Distance:    Right Eye Near:   Left Eye Near:    Bilateral Near:     Physical Exam Vitals signs and nursing note reviewed.  Constitutional:      Appearance: Normal appearance. She is well-developed.  HENT:     Head: Normocephalic and atraumatic.  Neck:     Musculoskeletal: Normal range of motion.  Cardiovascular:     Rate and Rhythm: Normal rate and regular rhythm.  Pulmonary:     Effort: Pulmonary effort is normal. No respiratory distress.     Breath sounds: Normal breath sounds.  Abdominal:     General: There is no distension.     Palpations: Abdomen is soft.     Tenderness: There is no abdominal tenderness. There is no right CVA tenderness or left CVA tenderness.  Musculoskeletal: Normal range of motion.  Skin:    General: Skin is warm and dry.  Neurological:     Mental Status: She is alert and  oriented to person, place, and time.  Psychiatric:        Behavior: Behavior normal.      UC Treatments / Results  Labs (all  labs ordered are listed, but only abnormal results are displayed) Labs Reviewed  POCT URINALYSIS DIP (MANUAL ENTRY) - Abnormal; Notable for the following components:      Result Value   Blood, UA moderate (*)    pH, UA 8.5 (*)    Leukocytes, UA Small (1+) (*)    All other components within normal limits  URINE CULTURE  CERVICOVAGINAL ANCILLARY ONLY    EKG   Radiology No results found.  Procedures Procedures (including critical care time)  Medications Ordered in UC Medications - No data to display  Initial Impression / Assessment and Plan / UC Course  I have reviewed the triage vital signs and the nursing notes.  Pertinent labs & imaging results that were available during my care of the patient were reviewed by me and considered in my medical decision making (see chart for details).     UA: no definite UTI Culture sent Symptoms more c/w vaginal yeast infection Self-swab sent to lab Pt may take diflucan and use the prescribed vagisil cream for symptomatic relief AVS provided  Final Clinical Impressions(s) / UC Diagnoses   Final diagnoses:  Dysuria  Vaginitis and vulvovaginitis     Discharge Instructions      You will be notified in about 2-3 days of test results if any additional treatment is needed.  Please follow up with your family doctor or GYN next week if not improving or if symptoms improve but quickly return.     ED Prescriptions    Medication Sig Dispense Auth. Provider   fluconazole (DIFLUCAN) 150 MG tablet Take 1 tablet (150 mg total) by mouth once for 1 dose. 1 tablet Lurene Shadow, PA-C   benzocaine-resorcinol (VAGISIL) 5-2 % vaginal cream Place vaginally at bedtime. 60 g Lurene Shadow, PA-C   phenazopyridine (PYRIDIUM) 200 MG tablet Take 1 tablet (200 mg total) by mouth 3 (three) times daily. 6 tablet Lurene Shadow, PA-C     PDMP not reviewed this encounter.   Lurene Shadow, PA-C 11/13/18 1008

## 2018-11-14 LAB — URINE CULTURE
MICRO NUMBER:: 1053577
SPECIMEN QUALITY:: ADEQUATE

## 2018-11-15 LAB — CERVICOVAGINAL ANCILLARY ONLY
Bacterial vaginitis: NEGATIVE
Candida vaginitis: NEGATIVE

## 2019-01-17 ENCOUNTER — Encounter: Payer: Self-pay | Admitting: Family Medicine

## 2019-10-23 ENCOUNTER — Encounter: Payer: Self-pay | Admitting: Family Medicine

## 2019-10-23 DIAGNOSIS — F339 Major depressive disorder, recurrent, unspecified: Secondary | ICD-10-CM

## 2019-10-23 NOTE — Telephone Encounter (Signed)
I went ahead and placed referral to psychiatry.

## 2019-11-01 ENCOUNTER — Telehealth (INDEPENDENT_AMBULATORY_CARE_PROVIDER_SITE_OTHER): Payer: 59 | Admitting: Psychiatry

## 2019-11-01 ENCOUNTER — Encounter (HOSPITAL_COMMUNITY): Payer: Self-pay | Admitting: Psychiatry

## 2019-11-01 DIAGNOSIS — F411 Generalized anxiety disorder: Secondary | ICD-10-CM | POA: Diagnosis not present

## 2019-11-01 DIAGNOSIS — F5102 Adjustment insomnia: Secondary | ICD-10-CM | POA: Diagnosis not present

## 2019-11-01 DIAGNOSIS — F331 Major depressive disorder, recurrent, moderate: Secondary | ICD-10-CM

## 2019-11-01 MED ORDER — VENLAFAXINE HCL 25 MG PO TABS: 12.5000 mg | ORAL_TABLET | Freq: Every day | ORAL | 0 refills | Status: DC
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
## 2019-11-13 ENCOUNTER — Other Ambulatory Visit: Payer: Self-pay | Admitting: Family Medicine

## 2019-11-21 ENCOUNTER — Other Ambulatory Visit: Payer: Self-pay | Admitting: Obstetrics and Gynecology

## 2019-11-21 DIAGNOSIS — R928 Other abnormal and inconclusive findings on diagnostic imaging of breast: Secondary | ICD-10-CM

## 2019-11-29 ENCOUNTER — Ambulatory Visit
Admission: RE | Admit: 2019-11-29 | Discharge: 2019-11-29 | Disposition: A | Discharge status: Home / Self Care | Payer: 59 | Source: Ambulatory Visit | Attending: Obstetrics and Gynecology | Admitting: Obstetrics and Gynecology

## 2019-11-29 ENCOUNTER — Other Ambulatory Visit: Payer: Self-pay

## 2019-11-29 ENCOUNTER — Other Ambulatory Visit: Payer: Self-pay | Admitting: Obstetrics and Gynecology

## 2019-11-29 ENCOUNTER — Ambulatory Visit: Payer: 59

## 2019-11-29 DIAGNOSIS — R928 Other abnormal and inconclusive findings on diagnostic imaging of breast: Secondary | ICD-10-CM

## 2019-11-30 ENCOUNTER — Encounter (HOSPITAL_COMMUNITY): Payer: Self-pay | Admitting: Psychiatry

## 2019-11-30 ENCOUNTER — Telehealth (INDEPENDENT_AMBULATORY_CARE_PROVIDER_SITE_OTHER): Payer: 59 | Admitting: Psychiatry

## 2019-11-30 DIAGNOSIS — F411 Generalized anxiety disorder: Secondary | ICD-10-CM

## 2019-11-30 DIAGNOSIS — F5102 Adjustment insomnia: Secondary | ICD-10-CM

## 2019-11-30 DIAGNOSIS — F331 Major depressive disorder, recurrent, moderate: Secondary | ICD-10-CM | POA: Diagnosis not present

## 2019-11-30 MED ORDER — VENLAFAXINE HCL 25 MG PO TABS: 12.5000 mg | ORAL_TABLET | Freq: Every day | ORAL | 0 refills | Status: DC

## 2019-11-30 NOTE — Progress Notes (Signed)
[facility] Follow up visit Patient Identification: Nancy Choi MRN:  [id number] Date of Evaluation:  11/30/2019 Referral Source: primary care Chief Complaint:  mood symptoms, insomnia follow up  Visit Diagnosis:    ICD-10-CM   1. MDD (major depressive disorder), recurrent episode, moderate (HCC)  F33.1   2. GAD (generalized anxiety disorder)  F41.1   3. Adjustment insomnia  F51.02      I connected with Nancy Choi on 11/30/19 at 10:00 AM EST by a video enabled telemedicine application and verified that I am speaking with the correct person using two identifiers. Location: Patient: home Provider: home office    I discussed the limitations of evaluation and management by telemedicine and the availability of in person appointments. The patient expressed understanding and agreed to proceed. 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 No kids Brother lives next door.   Long history of depression and anxiety had seen psychologist in 2010 after breakup   Had tried effexor stoppage before but would have withdrawals  Last visit started back 12.5mg  and has helped, mood is better and anxiety manageable during the day wellbutrin is still at 300mg  am  Still has poor sleep, has tried most meds, takes melatonin  Aggravating factor: relationship with mom. Past relationship, husband death Modifying factor: job, , gym work out Duration : adult life   Drug use denies Past psych admission or suicide attempt: denies     Past Psychiatric History: depression, anxiety  Previous Psychotropic Medications: Yes     Past Medical History:  Past Medical History:  Diagnosis Date  . Allergy   . DEPRESSION 08/14/2008   Qualifier: Diagnosis of  By: 10/14/2008 LPN, Gabriel Rung    . Generalized anxiety disorder 08/14/2008   Qualifier:  Diagnosis of  By: 10/14/2008 LPN, Gabriel Rung    . HYPOTHYROIDISM 08/14/2008   Qualifier: Diagnosis of  By: 10/14/2008 LPN, Gabriel Rung      Past Surgical History:  Procedure Laterality Date  . BREAST SURGERY      Family Psychiatric History: mom: mood symptoms , depression  Family History:  Family History  Problem Relation Age of Onset  . Cancer Mother        breast  . Depression Mother   . Hyperlipidemia Father   . Diabetes Paternal Grandmother   . Stomach cancer Neg Hx   . Rectal cancer Neg Hx   . Esophageal cancer Neg Hx   . Colon polyps Neg Hx   . Colon cancer Neg Hx     Social History:   Social History   Socioeconomic History  . Marital status: Married    Spouse name: Not on file  . Number of children: Not on file  . Years of education: Not on file  . Highest education level: Not on file  Occupational History  . Not on file  Tobacco Use  . Smoking status: Never Smoker  . Smokeless tobacco: Never Used  Vaping Use  . Vaping Use: Never used  Substance and Sexual Activity  . Alcohol use: No    Alcohol/week: 0.0 standard drinks  . Drug use: No  . Sexual activity: Not on file  Other Topics Concern  . Not on file  Social History Narrative  . Not on file   Social Determinants of Health   Financial Resource Strain:   . Difficulty of Paying Living Expenses:  Not on file  Food Insecurity:   . Worried About Programme researcher, broadcasting/film/video in the Last Year: Not on file  . Ran Out of Food in the Last Year: Not on file  Transportation Needs:   . Lack of Transportation (Medical): Not on file  . Lack of Transportation (Non-Medical): Not on file  Physical Activity:   . Days of Exercise per Week: Not on file  . Minutes of Exercise per Session: Not on file  Stress:   . Feeling of Stress : Not on file  Social Connections:   . Frequency of Communication with Friends and Family: Not on file  . Frequency of Social Gatherings with Friends and Family: Not on file  . Attends Religious Services: Not on  file  . Active Member of Clubs or Organizations: Not on file  . Attends Banker Meetings: Not on file  . Marital Status: Not on file     Allergies:   Allergies  Allergen Reactions  . Codeine Sulfate     REACTION: irritable, bugs crawling on skin sensation    Metabolic Disorder Labs: No results found for: HGBA1C, MPG No results found for: PROLACTIN Lab Results  Component Value Date   CHOL 198 07/08/2018   TRIG 52.0 07/08/2018   HDL 91.10 07/08/2018   CHOLHDL 2 07/08/2018   VLDL 10.4 07/08/2018   LDLCALC 97 07/08/2018   LDLCALC 110 (H) 09/21/2012   Lab Results  Component Value Date   TSH <0.01 Repeated and verified X2. (L) 07/08/2018    Therapeutic Level Labs: No results found for: LITHIUM No results found for: CBMZ No results found for: VALPROATE  Current Medications: Current Outpatient Medications  Medication Sig Dispense Refill  . ARMOUR THYROID 60 MG tablet Takes 60mg  in the morning and 60 mg at night.    . Ascorbic Acid (VITAMIN C) 1000 MG tablet Take 1,000 mg by mouth daily.    . benzocaine-resorcinol (VAGISIL) 5-2 % vaginal cream Place vaginally at bedtime. 60 g 0  . buPROPion (WELLBUTRIN XL) 300 MG 24 hr tablet Take 1 tablet (300 mg total) by mouth daily. 90 tablet 3  . Cholecalciferol (VITAMIN D) 2000 UNITS CAPS Take by mouth 3 (three) times a week.     . estradiol (ESTRACE) 1 MG tablet TAKE ONE AND A HALF TABLET BY MOUTH EVERY DAY    . INTRAROSA 6.5 MG INST INSERT 1 APPLICATORFUL INTRAVAGINALLY AT BEDTIME NIGHTLY.    phenazopyridine (PYRIDIUM) 200 MG tablet Take 1 tablet (200 mg total) by mouth 3 (three) times daily. 6 tablet 0  . progesterone (PROMETRIUM) 100 MG capsule Take 150 mg by mouth daily.     . Testosterone 2 MG/24HR PT24 Place onto the skin.    Marland Kitchen venlafaxine (EFFEXOR) 25 MG tablet Take 0.5 tablets (12.5 mg total) by mouth daily. Compound to total dose of 12.5mg  from capsule or tablet as per past history of taking it 45 tablet 0  .  vitamin E 100 UNIT capsule Take 100 Units by mouth daily.     No current facility-administered medications for this visit.      Psychiatric Specialty Exam: Review of Systems  Cardiovascular: Negative for chest pain.  Psychiatric/Behavioral: Positive for sleep disturbance.    Last menstrual period 10/06/2013.There is no height or weight on file to calculate BMI.  General Appearance: Casual  Eye Contact:  Good  Speech:  Clear and Coherent  Volume:  Normal  Mood: fair  Affect:  Congruent  Thought Process:  Goal Directed  Orientation:  Full (Time, Place, and Person)  Thought Content:  Rumination  Suicidal Thoughts:  No  Homicidal Thoughts:  No  Memory:  Immediate;   Fair Recent;   Fair  Judgement:  Fair  Insight:  Fair  Psychomotor Activity:  Normal  Concentration:  Concentration: Fair and Attention Span: Fair  Recall:  Good  Fund of Knowledge:Good  Language: Good  Akathisia:  No  Handed:    AIMS (if indicated):  not done  Assets:  Communication Skills Desire for Improvement Financial Resources/Insurance Housing Physical Health  ADL's:  Intact  Cognition: WNL  Sleep:  variable   Screenings: PHQ2-9     Office Visit from 07/08/2018 in [facility] at [facility] Total Score 6  PHQ-9 Total Score 14      Assessment and Plan: as follows  MDD recurrent moderate: doing fair with wellbutrin 300mg  and effexor 12.5. wants to continue  GAD ; improved with effeoxr. continue Insomnia: reviewed sleep hygiene, still disturbed, consider sleep evaluation or sleep study, can continue melatonin for now   I discussed the assessment and treatment plan with the patient. The patient was provided an opportunity to ask questions and all were answered. The patient agreed with the plan and demonstrated an understanding of the instructions.   The patient was advised to call back or seek an in-person evaluation if the symptoms worsen or if the condition fails to improve as  anticipated. Fu 2-61m.  I provided 15  Minutes or less of non-face-to-face time during this encounter. 1m, MD 11/18/202110:18 AM

## 2019-12-11 ENCOUNTER — Other Ambulatory Visit: Payer: Self-pay | Admitting: Obstetrics and Gynecology

## 2019-12-11 DIAGNOSIS — R928 Other abnormal and inconclusive findings on diagnostic imaging of breast: Secondary | ICD-10-CM

## 2020-01-01 ENCOUNTER — Telehealth (HOSPITAL_COMMUNITY): Payer: Self-pay | Admitting: Psychiatry

## 2020-01-01 MED ORDER — VENLAFAXINE HCL 25 MG PO TABS: 12.5000 mg | ORAL_TABLET | Freq: Every day | ORAL | 0 refills | Status: DC

## 2020-01-01 NOTE — Telephone Encounter (Signed)
sent 

## 2020-01-01 NOTE — Telephone Encounter (Signed)
Pt needs refill on effexoe sent to [facility] pharmacy

## 2020-02-02 ENCOUNTER — Other Ambulatory Visit (HOSPITAL_COMMUNITY): Payer: Self-pay

## 2020-02-02 MED ORDER — VENLAFAXINE HCL 25 MG PO TABS: 12.5000 mg | ORAL_TABLET | Freq: Every day | ORAL | 2 refills | Status: DC

## 2020-03-01 ENCOUNTER — Encounter (HOSPITAL_COMMUNITY): Payer: Self-pay | Admitting: Psychiatry

## 2020-03-01 ENCOUNTER — Telehealth (INDEPENDENT_AMBULATORY_CARE_PROVIDER_SITE_OTHER): Payer: 59 | Admitting: Psychiatry

## 2020-03-01 DIAGNOSIS — F411 Generalized anxiety disorder: Secondary | ICD-10-CM

## 2020-03-01 DIAGNOSIS — F331 Major depressive disorder, recurrent, moderate: Secondary | ICD-10-CM | POA: Diagnosis not present

## 2020-03-01 NOTE — Progress Notes (Signed)
sent 

## 2020-03-01 NOTE — Progress Notes (Signed)
[facility] Follow up visit Patient Identification: Nancy Choi MRN:  [id number] Date of Evaluation:  03/01/2020 Referral Source: primary care Chief Complaint:  mood symptoms, insomnia follow up  Visit Diagnosis:    ICD-10-CM   1. MDD (major depressive disorder), recurrent episode, moderate (HCC)  F33.1   2. GAD (generalized anxiety disorder)  F41.1      Virtual Visit via Video Note  I connected with Nancy Choi on 03/01/20 at 10:00 AM EST by a video enabled telemedicine application and verified that I am speaking with the correct person using two identifiers.  Location: Patient: home Provider: office   I discussed the limitations of evaluation and management by telemedicine and the availability of in person appointments. The patient expressed understanding and agreed to proceed.      I discussed the assessment and treatment plan with the patient. The patient was provided an opportunity to ask questions and all were answered. The patient agreed with the plan and demonstrated an understanding of the instructions.   The patient was advised to call back or seek an in-person evaluation if the symptoms worsen or if the condition fails to improve as anticipated.  I provided 10 minutes of non-face-to-face time during this encounter.   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 No kids Brother lives next door.   Long history of depression and anxiety had seen psychologist in 2010 after breakup    Continues to do fair on small dose of effexor Some stress related to going back to office in April but she may discuss with supervisor if doesn't need to Sleep improved. No side effects Mood is balanced Takes melatonin for sleep small dose    Aggravating factor:  Past relationship, husband  death Modifying factor: job, cats, gym work out Duration : adult life   Drug use denies Past psych admission or suicide attempt: denies     Past Psychiatric History: depression, anxiety  Previous Psychotropic Medications: Yes     Past Medical History:  Past Medical History:  Diagnosis Date  . Allergy   . DEPRESSION 08/14/2008   Qualifier: Diagnosis of  By: Gabriel Rung LPN, Harriett Sine    . Generalized anxiety disorder 08/14/2008   Qualifier: Diagnosis of  By: Gabriel Rung LPN, Harriett Sine    . HYPOTHYROIDISM 08/14/2008   Qualifier: Diagnosis of  By: Gabriel Rung LPN, Harriett Sine      Past Surgical History:  Procedure Laterality Date  . BREAST SURGERY      Family Psychiatric History: mom: mood symptoms , depression  Family History:  Family History  Problem Relation Age of Onset  . Cancer Mother        breast  . Depression Mother   . Hyperlipidemia Father   . Diabetes Paternal Grandmother   . Stomach cancer Neg Hx   . Rectal cancer Neg Hx   . Esophageal cancer Neg Hx   . Colon polyps Neg Hx   . Colon cancer Neg Hx     Social History:   Social History   Socioeconomic History  . Marital status: Married    Spouse name: Not on file  . Number of children: Not on file  . Years of education: Not on file  . Highest education level: Not on file  Occupational History  . Not on file  Tobacco Use  . Smoking status:  Never Smoker  . Smokeless tobacco: Never Used  Vaping Use  . Vaping Use: Never used  Substance and Sexual Activity  . Alcohol use: No    Alcohol/week: 0.0 standard drinks  . Drug use: No  . Sexual activity: Not on file  Other Topics Concern  . Not on file  Social History Narrative  . Not on file   Social Determinants of Health   Financial Resource Strain: Not on file  Food Insecurity: Not on file  Transportation Needs: Not on file  Physical Activity: Not on file  Stress: Not on file  Social Connections: Not on file     Allergies:   Allergies  Allergen Reactions  .  Codeine Sulfate     REACTION: irritable, bugs crawling on skin sensation    Metabolic Disorder Labs: No results found for: HGBA1C, MPG No results found for: PROLACTIN Lab Results  Component Value Date   CHOL 198 07/08/2018   TRIG 52.0 07/08/2018   HDL 91.10 07/08/2018   CHOLHDL 2 07/08/2018   VLDL 10.4 07/08/2018   LDLCALC 97 07/08/2018   LDLCALC 110 (H) 09/21/2012   Lab Results  Component Value Date   TSH <0.01 Repeated and verified X2. (L) 07/08/2018    Therapeutic Level Labs: No results found for: LITHIUM No results found for: CBMZ No results found for: VALPROATE  Current Medications: Current Outpatient Medications  Medication Sig Dispense Refill  . ARMOUR THYROID 60 MG tablet Takes 60mg  in the morning and 60 mg at night.    . Ascorbic Acid (VITAMIN C) 1000 MG tablet Take 1,000 mg by mouth daily.    . benzocaine-resorcinol (VAGISIL) 5-2 % vaginal cream Place vaginally at bedtime. 60 g 0  . buPROPion (WELLBUTRIN XL) 300 MG 24 hr tablet Take 1 tablet (300 mg total) by mouth daily. 90 tablet 3  . Cholecalciferol (VITAMIN D) 2000 UNITS CAPS Take by mouth 3 (three) times a week.     . estradiol (ESTRACE) 1 MG tablet TAKE ONE AND A HALF TABLET BY MOUTH EVERY DAY    . INTRAROSA 6.5 MG INST INSERT 1 APPLICATORFUL INTRAVAGINALLY AT BEDTIME NIGHTLY.    phenazopyridine (PYRIDIUM) 200 MG tablet Take 1 tablet (200 mg total) by mouth 3 (three) times daily. 6 tablet 0  . progesterone (PROMETRIUM) 100 MG capsule Take 150 mg by mouth daily.     . Testosterone 2 MG/24HR PT24 Place onto the skin.    Marland Kitchen venlafaxine (EFFEXOR) 25 MG tablet Take 0.5 tablets (12.5 mg total) by mouth daily. Compound to total dose of 12.5mg  from capsule or tablet as per past history of taking it 45 tablet 2  . vitamin E 100 UNIT capsule Take 100 Units by mouth daily.     No current facility-administered medications for this visit.      Psychiatric Specialty Exam: Review of Systems  Cardiovascular:  Negative for chest pain.  Psychiatric/Behavioral: Negative for dysphoric mood.    Last menstrual period 10/06/2013.There is no height or weight on file to calculate BMI.  General Appearance: Casual  Eye Contact:  Good  Speech:  Clear and Coherent  Volume:  Normal  Mood: fair  Affect:  Congruent  Thought Process:  Goal Directed  Orientation:  Full (Time, Place, and Person)  Thought Content:  Rumination  Suicidal Thoughts:  No  Homicidal Thoughts:  No  Memory:  Immediate;   Fair Recent;   Fair  Judgement:  Fair  Insight:  Fair  Psychomotor Activity:  Normal  Concentration:  Concentration: Fair and Attention Span: Fair  Recall:  Good  Fund of Knowledge:Good  Language: Good  Akathisia:  No  Handed:    AIMS (if indicated):  not done  Assets:  Communication Skills Desire for Improvement Financial Resources/Insurance Housing Physical Health  ADL's:  Intact  Cognition: WNL  Sleep:  variable   Screenings: Secondary school teacher Row Office Visit from 07/08/2018 in [facility] [place]  PHQ-2 Total Score 6  PHQ-9 Total Score 14    Flowsheet Row Video [facility] [place]  C-SSRS RISK CATEGORY No Risk      Assessment and Plan: as follows Prior documentation reviewed MDD recurrent moderate: stable continue wellbutrin 300mg  and small dose of effexor.   GAD ; fair, continue effexor Insomnia: better on melatonin  Fu 55m.  1m, MD 2/18/202210:15 AM

## 2020-04-19 ENCOUNTER — Encounter: Payer: Self-pay | Admitting: Family Medicine

## 2020-04-30 ENCOUNTER — Encounter: Payer: Self-pay | Admitting: Family Medicine

## 2020-04-30 ENCOUNTER — Other Ambulatory Visit: Payer: Self-pay

## 2020-04-30 ENCOUNTER — Ambulatory Visit (INDEPENDENT_AMBULATORY_CARE_PROVIDER_SITE_OTHER): Payer: 59 | Admitting: Family Medicine

## 2020-04-30 VITALS — BP 110/62 | HR 65 | Temp 97.9°F | Ht 68.0 in | Wt 123.2 lb

## 2020-04-30 DIAGNOSIS — Z0000 Encounter for general adult medical examination without abnormal findings: Secondary | ICD-10-CM

## 2020-04-30 LAB — CBC WITH DIFFERENTIAL/PLATELET
Basophils Absolute: 0 10*3/uL (ref 0.0–0.1)
Basophils Relative: 0.4 % (ref 0.0–3.0)
Eosinophils Absolute: 0.1 10*3/uL (ref 0.0–0.7)
Eosinophils Relative: 1.1 % (ref 0.0–5.0)
HCT: 40.7 % (ref 36.0–46.0)
Hemoglobin: 13.6 g/dL (ref 12.0–15.0)
Lymphocytes Relative: 35.3 % (ref 12.0–46.0)
Lymphs Abs: 2.4 10*3/uL (ref 0.7–4.0)
MCHC: 33.3 g/dL (ref 30.0–36.0)
MCV: 91.8 fl (ref 78.0–100.0)
Monocytes Absolute: 0.4 10*3/uL (ref 0.1–1.0)
Monocytes Relative: 6.3 % (ref 3.0–12.0)
Neutro Abs: 3.8 10*3/uL (ref 1.4–7.7)
Neutrophils Relative %: 56.9 % (ref 43.0–77.0)
Platelets: 210 10*3/uL (ref 150.0–400.0)
RBC: 4.44 Mil/uL (ref 3.87–5.11)
RDW: 13 % (ref 11.5–15.5)
WBC: 6.7 10*3/uL (ref 4.0–10.5)

## 2020-04-30 LAB — HEPATIC FUNCTION PANEL
ALT: 11 U/L (ref 0–35)
AST: 15 U/L (ref 0–37)
Albumin: 4 g/dL (ref 3.5–5.2)
Alkaline Phosphatase: 78 U/L (ref 39–117)
Bilirubin, Direct: 0.1 mg/dL (ref 0.0–0.3)
Total Bilirubin: 0.5 mg/dL (ref 0.2–1.2)
Total Protein: 6.7 g/dL (ref 6.0–8.3)

## 2020-04-30 LAB — BASIC METABOLIC PANEL
BUN: 19 mg/dL (ref 6–23)
CO2: 30 mEq/L (ref 19–32)
Calcium: 9.3 mg/dL (ref 8.4–10.5)
Chloride: 104 mEq/L (ref 96–112)
Creatinine, Ser: 0.75 mg/dL (ref 0.40–1.20)
GFR: 89.65 mL/min (ref >60.00)
Glucose, Bld: 85 mg/dL (ref 70–99)
Potassium: 4.2 mEq/L (ref 3.5–5.1)
Sodium: 139 mEq/L (ref 135–145)

## 2020-04-30 LAB — LIPID PANEL
Cholesterol: 193 mg/dL (ref 0–200)
HDL: 92.9 mg/dL
LDL Cholesterol: 93 mg/dL (ref 0–99)
NonHDL: 100.55
Total CHOL/HDL Ratio: 2
Triglycerides: 38 mg/dL (ref 0.0–149.0)
VLDL: 7.6 mg/dL (ref 0.0–40.0)

## 2020-04-30 LAB — TSH: TSH: 0.02 u[IU]/mL — ABNORMAL LOW (ref 0.35–4.50)

## 2020-04-30 LAB — VITAMIN D 25 HYDROXY (VIT D DEFICIENCY, FRACTURES): VITD: 37.43 ng/mL (ref 30.00–100.00)

## 2020-04-30 MED ORDER — BUPROPION HCL ER (XL) 150 MG PO TB24: 150.0000 mg | ORAL_TABLET | Freq: Every day | ORAL | 3 refills | Status: DC

## 2020-04-30 NOTE — Patient Instructions (Signed)
Preventive Care 56-56 Years Old, Female Preventive care refers to lifestyle choices and visits with your health care provider that can promote health and wellness. This includes:  A yearly physical exam. This is also called an annual wellness visit.  Regular dental and eye exams.  Immunizations.  Screening for certain conditions.  Healthy lifestyle choices, such as: ? Eating a healthy diet. ? Getting regular exercise. ? Not using drugs or products that contain nicotine and tobacco. ? Limiting alcohol use. What can I expect for my preventive care visit? Physical exam Your health care provider will check your:  Height and weight. These may be used to calculate your BMI (body mass index). BMI is a measurement that tells if you are at a healthy weight.  Heart rate and blood pressure.  Body temperature.  Skin for abnormal spots. Counseling Your health care provider may ask you questions about your:  Past medical problems.  Family's medical history.  Alcohol, tobacco, and drug use.  Emotional well-being.  Home life and relationship well-being.  Sexual activity.  Diet, exercise, and sleep habits.  Work and work Statistician.  Access to firearms.  Method of birth control.  Menstrual cycle.  Pregnancy history. What immunizations do I need? Vaccines are usually given at various ages, according to a schedule. Your health care provider will recommend vaccines for you based on your age, medical history, and lifestyle or other factors, such as travel or where you work.   What tests do I need? Blood tests  Lipid and cholesterol levels. These may be checked every 5 years, or more often if you are over 3 years old.  Hepatitis C test.  Hepatitis B test. Screening  Lung cancer screening. You may have this screening every year starting at age 56 if you have a 30-pack-year history of smoking and currently smoke or have quit within the past 15 years.  Colorectal cancer  screening. ? All adults should have this screening starting at age 56 and continuing until age 17. ? Your health care provider may recommend screening at age 56 if you are at increased risk. ? You will have tests every 1-10 years, depending on your results and the type of screening test.  Diabetes screening. ? This is done by checking your blood sugar (glucose) after you have not eaten for a while (fasting). ? You may have this done every 1-3 years.  Mammogram. ? This may be done every 1-2 years. ? Talk with your health care provider about when you should start having regular mammograms. This may depend on whether you have a family history of breast cancer.  BRCA-related cancer screening. This may be done if you have a family history of breast, ovarian, tubal, or peritoneal cancers.  Pelvic exam and Pap test. ? This may be done every 3 years starting at age 56. ? Starting at age 56, this may be done every 5 years if you have a Pap test in combination with an HPV test. Other tests  STD (sexually transmitted disease) testing, if you are at risk.  Bone density scan. This is done to screen for osteoporosis. You may have this scan if you are at high risk for osteoporosis. Talk with your health care provider about your test results, treatment options, and if necessary, the need for more tests. Follow these instructions at home: Eating and drinking  Eat a diet that includes fresh fruits and vegetables, whole grains, lean protein, and low-fat dairy products.  Take vitamin and mineral supplements  as recommended by your health care provider.  Do not drink alcohol if: ? Your health care provider tells you not to drink. ? You are pregnant, may be pregnant, or are planning to become pregnant.  If you drink alcohol: ? Limit how much you have to 0-1 drink a day. ? Be aware of how much alcohol is in your drink. In the [place] one drink equals one 12 oz bottle of beer (355 mL), one 5 oz glass of  wine (148 mL), or one 1 oz glass of hard liquor (44 mL).   Lifestyle  Take daily care of your teeth and gums. Brush your teeth every morning and night with fluoride toothpaste. Floss one time each day.  Stay active. Exercise for at least 30 minutes 5 or more days each week.  Do not use any products that contain nicotine or tobacco, such as cigarettes, e-cigarettes, and chewing tobacco. If you need help quitting, ask your health care provider.  Do not use drugs.  If you are sexually active, practice safe sex. Use a condom or other form of protection to prevent STIs (sexually transmitted infections).  If you do not wish to become pregnant, use a form of birth control. If you plan to become pregnant, see your health care provider for a prepregnancy visit.  If told by your health care provider, take low-dose aspirin daily starting at age 56.  Find healthy ways to cope with stress, such as: ? Meditation, yoga, or listening to music. ? Journaling. ? Talking to a trusted person. ? Spending time with friends and family. Safety  Always wear your seat belt while driving or riding in a vehicle.  Do not drive: ? If you have been drinking alcohol. Do not ride with someone who has been drinking. ? When you are tired or distracted. ? While texting.  Wear a helmet and other protective equipment during sports activities.  If you have firearms in your house, make sure you follow all gun safety procedures. What's next?  Visit your health care provider once a year for an annual wellness visit.  Ask your health care provider how often you should have your eyes and teeth checked.  Stay up to date on all vaccines. This information is not intended to replace advice given to you by your health care provider. Make sure you discuss any questions you have with your health care provider. Document Revised: 10/03/2019 Document Reviewed: 09/09/2017 Elsevier Patient Education  2021 [employer]  

## 2020-04-30 NOTE — Progress Notes (Signed)
Established Patient Office Visit  Subjective:  Patient ID: Nancy Choi, female    DOB: 05-20-1965  Age: 55 y.o. MRN: [id number]  CC:  Chief Complaint  Patient presents with  . Annual Exam    No new concerns     HPI Nancy Choi presents for physical exam.  He has history of hypothyroidism, recurrent depression, generalized anxiety.  She has seen psychiatry in the past.  Is currently on very low-dose Effexor 25 mg and Wellbutrin.  She is on dosage of 300 mg Wellbutrin and would like to try to scale this back to 150 mg.  She stays very active and does weightbearing exercise and weights at least 4 hours/week.  She sees gynecologist regularly and had DEXA scan recently through them and states that her numbers were "improved ".  We do not have record of that DEXA scan.  She is also getting regular mammograms and Pap smears through GYN.  No history of hepatitis C screening but low risk.  No history of shingles vaccine.  COVID vaccines up-to-date.  Family history-mother has recurrent breast cancer currently stage IV.  She has 2 brothers that are doing relatively well.  Father has history of hypertension and hyperlipidemia.  No family history of premature CAD.  Social history-she is widowed.  Non-smoker.  Rare alcohol use.  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  Currently working from home.  Regular exercise as above.  No children  Past Medical History:  Diagnosis Date  . Allergy   . DEPRESSION 08/14/2008   Qualifier: Diagnosis of  By: Gabriel Rung LPN, Harriett Sine    . Generalized anxiety disorder 08/14/2008   Qualifier: Diagnosis of  By: Gabriel Rung LPN, Harriett Sine    . HYPOTHYROIDISM 08/14/2008   Qualifier: Diagnosis of  By: Gabriel Rung LPN, Harriett Sine      Past Surgical History:  Procedure Laterality Date  . BREAST SURGERY      Family History  Problem Relation Age of Onset  . Cancer Mother        breast  . Depression Mother   . Hyperlipidemia Father   . Hypertension  Father   . Diabetes Paternal Grandmother   . Stomach cancer Neg Hx   . Rectal cancer Neg Hx   . Esophageal cancer Neg Hx   . Colon polyps Neg Hx   . Colon cancer Neg Hx     Social History   Socioeconomic History  . Marital status: Married    Spouse name: Not on file  . Number of children: Not on file  . Years of education: Not on file  . Highest education level: Not on file  Occupational History  . Not on file  Tobacco Use  . Smoking status: Never Smoker  . Smokeless tobacco: Never Used  Vaping Use  . Vaping Use: Never used  Substance and Sexual Activity  . Alcohol use: No    Alcohol/week: 0.0 standard drinks  . Drug use: No  . Sexual activity: Not on file  Other Topics Concern  . Not on file  Social History Narrative  . Not on file   Social Determinants of Health   Financial Resource Strain: Not on file  Food Insecurity: Not on file  Transportation Needs: Not on file  Physical Activity: Not on file  Stress: Not on file  Social Connections: Not on file  Intimate Partner Violence: Not on file    Outpatient Medications Prior to Visit  Medication Sig Dispense  Refill  . ARMOUR THYROID 60 MG tablet Takes 60mg  in the morning and 60 mg at night.    . Ascorbic Acid (VITAMIN C) 1000 MG tablet Take 1,000 mg by mouth daily.    . benzocaine-resorcinol (VAGISIL) 5-2 % vaginal cream Place vaginally at bedtime. 60 g 0  . buPROPion (WELLBUTRIN XL) 300 MG 24 hr tablet Take 1 tablet (300 mg total) by mouth daily. 90 tablet 3  . Cholecalciferol (VITAMIN D) 2000 UNITS CAPS Take by mouth 3 (three) times a week.     . estradiol (ESTRACE) 1 MG tablet TAKE ONE AND A HALF TABLET BY MOUTH EVERY DAY    . INTRAROSA 6.5 MG INST INSERT 1 APPLICATORFUL INTRAVAGINALLY AT BEDTIME NIGHTLY.    phenazopyridine (PYRIDIUM) 200 MG tablet Take 1 tablet (200 mg total) by mouth 3 (three) times daily. 6 tablet 0  . progesterone (PROMETRIUM) 100 MG capsule Take 150 mg by mouth daily.     . Testosterone  2 MG/24HR PT24 Place onto the skin.    Marland Kitchen venlafaxine (EFFEXOR) 25 MG tablet Take 0.5 tablets (12.5 mg total) by mouth daily. Compound to total dose of 12.5mg  from capsule or tablet as per past history of taking it 45 tablet 2  . vitamin E 100 UNIT capsule Take 100 Units by mouth daily.     No facility-administered medications prior to visit.    Allergies  Allergen Reactions  . Codeine Sulfate     REACTION: irritable, bugs crawling on skin sensation    ROS Review of Systems  Constitutional: Negative for activity change, appetite change, fatigue, fever and unexpected weight change.  HENT: Negative for ear pain, hearing loss, sore throat and trouble swallowing.   Eyes: Negative for visual disturbance.  Respiratory: Negative for cough and shortness of breath.   Cardiovascular: Negative for chest pain and palpitations.  Gastrointestinal: Negative for abdominal pain, blood in stool, constipation and diarrhea.  Genitourinary: Negative for dysuria and hematuria.  Musculoskeletal: Negative for arthralgias, back pain and myalgias.  Skin: Negative for rash.  Neurological: Negative for dizziness, syncope and headaches.  Hematological: Negative for adenopathy.  Psychiatric/Behavioral: Negative for confusion and dysphoric mood.      Objective:    Physical Exam Constitutional:      Appearance: She is well-developed.  HENT:     Head: Normocephalic and atraumatic.  Eyes:     Pupils: Pupils are equal, round, and reactive to light.  Neck:     Thyroid: No thyromegaly.  Cardiovascular:     Rate and Rhythm: Normal rate and regular rhythm.     Heart sounds: Normal heart sounds. No murmur heard.   Pulmonary:     Effort: No respiratory distress.     Breath sounds: Normal breath sounds. No wheezing or rales.  Abdominal:     General: Bowel sounds are normal. There is no distension.     Palpations: Abdomen is soft. There is no mass.     Tenderness: There is no abdominal tenderness. There is  no guarding or rebound.  Genitourinary:    Comments: Per gyn Musculoskeletal:        General: Normal range of motion.     Cervical back: Normal range of motion and neck supple.  Lymphadenopathy:     Cervical: No cervical adenopathy.  Skin:    Findings: No rash.  Neurological:     Mental Status: She is alert and oriented to person, place, and time.     Cranial Nerves: No cranial nerve  deficit.     Deep Tendon Reflexes: Reflexes normal.  Psychiatric:        Behavior: Behavior normal.        Thought Content: Thought content normal.        Judgment: Judgment normal.     BP 110/62 (BP Location: Left Arm, Patient Position: Sitting, Cuff Size: Normal)   Pulse 65   Temp 97.9 F (36.6 C) (Oral)   Ht 5\' 8"  (1.727 m)   Wt 123 lb 3.2 oz (55.9 kg)   LMP 10/06/2013   SpO2 100%   BMI 18.73 kg/m  Wt Readings from Last 3 Encounters:  04/30/20 123 lb 3.2 oz (55.9 kg)  08/03/18 121 lb (54.9 kg)  07/08/18 119 lb 1.6 oz (54 kg)     Health Maintenance Due  Topic Date Due  . Hepatitis C Screening  Never done  . HIV Screening  Never done  . COVID-19 Vaccine (2 - Booster for Janssen series) 06/06/2019    There are no preventive care reminders to display for this patient.  Lab Results  Component Value Date   TSH 0.02 (L) 04/30/2020   Lab Results  Component Value Date   WBC 6.7 04/30/2020   HGB 13.6 04/30/2020   HCT 40.7 04/30/2020   MCV 91.8 04/30/2020   PLT 210.0 04/30/2020   Lab Results  Component Value Date   NA 139 04/30/2020   K 4.2 04/30/2020   CO2 30 04/30/2020   GLUCOSE 85 04/30/2020   BUN 19 04/30/2020   CREATININE 0.75 04/30/2020   BILITOT 0.5 04/30/2020   ALKPHOS 78 04/30/2020   AST 15 04/30/2020   ALT 11 04/30/2020   PROT 6.7 04/30/2020   ALBUMIN 4.0 04/30/2020   CALCIUM 9.3 04/30/2020   GFR 89.65 04/30/2020   Lab Results  Component Value Date   CHOL 193 04/30/2020   Lab Results  Component Value Date   HDL 92.90 04/30/2020   Lab Results   Component Value Date   LDLCALC 93 04/30/2020   Lab Results  Component Value Date   TRIG 38.0 04/30/2020   Lab Results  Component Value Date   CHOLHDL 2 04/30/2020   No results found for: HGBA1C    Assessment & Plan:   Problem List Items Addressed This Visit   None   Visit Diagnoses    Physical exam    -  Primary   Relevant Orders   Basic metabolic panel (Completed)   Lipid panel (Completed)   CBC with Differential/Platelet (Completed)   TSH (Completed)   Hepatic function panel (Completed)   Hep C Antibody   VITAMIN D 25 Hydroxy (Vit-D Deficiency, Fractures) (Completed)    -Obtain follow-up labs as above -Include hepatitis C antibody -Discussed Shingrix vaccine and she will consider -Continue regular weightbearing exercise and daily calcium and vitamin D - try scaling Wellbutrin back to Wellbutrin XL 150 mg once daily -Continue annual flu vaccine -She will continue GYN follow-up for Pap smears and mammograms  No orders of the defined types were placed in this encounter.   Follow-up: No follow-ups on file.    05/02/2020, MD

## 2020-05-01 LAB — HEPATITIS C ANTIBODY
Hepatitis C Ab: NONREACTIVE
SIGNAL TO CUT-OFF: 0.01 (ref <1.00)

## 2020-05-29 ENCOUNTER — Other Ambulatory Visit: Payer: Self-pay

## 2020-05-29 ENCOUNTER — Ambulatory Visit
Admission: RE | Admit: 2020-05-29 | Discharge: 2020-05-29 | Disposition: A | Discharge status: Home / Self Care | Payer: 59 | Source: Ambulatory Visit | Attending: Obstetrics and Gynecology | Admitting: Obstetrics and Gynecology

## 2020-05-29 ENCOUNTER — Other Ambulatory Visit: Payer: Self-pay | Admitting: Obstetrics and Gynecology

## 2020-05-29 ENCOUNTER — Ambulatory Visit: Admission: RE | Admit: 2020-05-29 | Payer: 59 | Source: Ambulatory Visit

## 2020-05-29 DIAGNOSIS — R928 Other abnormal and inconclusive findings on diagnostic imaging of breast: Secondary | ICD-10-CM

## 2020-05-29 DIAGNOSIS — R921 Mammographic calcification found on diagnostic imaging of breast: Secondary | ICD-10-CM

## 2020-05-30 ENCOUNTER — Telehealth (INDEPENDENT_AMBULATORY_CARE_PROVIDER_SITE_OTHER): Payer: 59 | Admitting: Psychiatry

## 2020-05-30 ENCOUNTER — Encounter (HOSPITAL_COMMUNITY): Payer: Self-pay | Admitting: Psychiatry

## 2020-05-30 DIAGNOSIS — F411 Generalized anxiety disorder: Secondary | ICD-10-CM

## 2020-05-30 DIAGNOSIS — F331 Major depressive disorder, recurrent, moderate: Secondary | ICD-10-CM | POA: Diagnosis not present

## 2020-05-30 DIAGNOSIS — F5102 Adjustment insomnia: Secondary | ICD-10-CM

## 2020-05-30 MED ORDER — VENLAFAXINE HCL 25 MG PO TABS: 12.5000 mg | ORAL_TABLET | Freq: Every day | ORAL | 2 refills | Status: DC

## 2020-05-30 NOTE — Progress Notes (Signed)
[facility] Follow up visit Patient Identification: Nancy Choi MRN:  [id number] Date of Evaluation:  05/30/2020 Referral Source: primary care Chief Complaint:  mood symptoms, insomnia follow up  Visit Diagnosis:    ICD-10-CM   1. MDD (major depressive disorder), recurrent episode, moderate (HCC)  F33.1   2. GAD (generalized anxiety disorder)  F41.1   3. Adjustment insomnia  F51.02    Virtual Visit via Video Note  I connected with [name] on 05/30/20 at  9:30 AM EDT by a video enabled telemedicine application and verified that I am speaking with the correct person using two identifiers.  Location: Patient: home Provider: office   I discussed the limitations of evaluation and management by telemedicine and the availability of in person appointments. The patient expressed understanding and agreed to proceed.  I discussed the assessment and treatment plan with the patient. The patient was provided an opportunity to ask questions and all were answered. The patient agreed with the plan and demonstrated an understanding of the instructions.   The patient was advised to call back or seek an in-person evaluation if the symptoms worsen or if the condition fails to improve as anticipated.  I provided 10  minutes of non-face-to-face time during this encounter.   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 No kids Brother lives next door.   Patient is doing well primary care has cut down Wellbutrin to 150 mg In general the small dose of Effexor does help with anxiety and depression she takes melatonin at night for sleep has 2 cats  Some stress related with parents who are older     Aggravating factor:  Past relationship, husband death Modifying factor: job, cats Duration : adult life   Drug use denies Past psych admission or suicide attempt: denies     Past Psychiatric History: depression,  anxiety  Previous Psychotropic Medications: Yes     Past Medical History:  Past Medical History:  Diagnosis Date  . Allergy   . DEPRESSION 08/14/2008   Qualifier: Diagnosis of  By: Gabriel Rung LPN, Harriett Sine    . Generalized anxiety disorder 08/14/2008   Qualifier: Diagnosis of  By: Gabriel Rung LPN, Harriett Sine    . HYPOTHYROIDISM 08/14/2008   Qualifier: Diagnosis of  By: Gabriel Rung LPN, Harriett Sine      Past Surgical History:  Procedure Laterality Date  . BREAST SURGERY      Family Psychiatric History: mom: mood symptoms , depression  Family History:  Family History  Problem Relation Age of Onset  . Cancer Mother        breast  . Depression Mother   . Hyperlipidemia Father   . Hypertension Father   . Diabetes Paternal Grandmother   . Stomach cancer Neg Hx   . Rectal cancer Neg Hx   . Esophageal cancer Neg Hx   . Colon polyps Neg Hx   . Colon cancer Neg Hx     Social History:   Social History   Socioeconomic History  . Marital status: Married    Spouse name: Not on file  . Number of children: Not on file  . Years of education: Not on file  . Highest education level: Not on file  Occupational History  . Not on file  Tobacco Use  . Smoking status: Never Smoker  . Smokeless tobacco: Never Used  Vaping Use  . Vaping Use: Never used  Substance and Sexual Activity  . Alcohol use:  No    Alcohol/week: 0.0 standard drinks  . Drug use: No  . Sexual activity: Not on file  Other Topics Concern  . Not on file  Social History Narrative  . Not on file   Social Determinants of Health   Financial Resource Strain: Not on file  Food Insecurity: Not on file  Transportation Needs: Not on file  Physical Activity: Not on file  Stress: Not on file  Social Connections: Not on file     Allergies:   Allergies  Allergen Reactions  . Codeine Sulfate     REACTION: irritable, bugs crawling on skin sensation    Metabolic Disorder Labs: No results found for: HGBA1C, MPG No results found for:  PROLACTIN Lab Results  Component Value Date   CHOL 193 04/30/2020   TRIG 38.0 04/30/2020   HDL 92.90 04/30/2020   CHOLHDL 2 04/30/2020   VLDL 7.6 04/30/2020   LDLCALC 93 04/30/2020   LDLCALC 97 07/08/2018   Lab Results  Component Value Date   TSH 0.02 (L) 04/30/2020    Therapeutic Level Labs: No results found for: LITHIUM No results found for: CBMZ No results found for: VALPROATE  Current Medications: Current Outpatient Medications  Medication Sig Dispense Refill  . ARMOUR THYROID 60 MG tablet Takes 60mg  in the morning and 60 mg at night.    . Ascorbic Acid (VITAMIN C) 1000 MG tablet Take 1,000 mg by mouth daily.    . benzocaine-resorcinol (VAGISIL) 5-2 % vaginal cream Place vaginally at bedtime. 60 g 0  . buPROPion (WELLBUTRIN XL) 150 MG 24 hr tablet Take 1 tablet (150 mg total) by mouth daily. 90 tablet 3  . Cholecalciferol (VITAMIN D) 2000 UNITS CAPS Take by mouth 3 (three) times a week.     . estradiol (ESTRACE) 1 MG tablet TAKE ONE AND A HALF TABLET BY MOUTH EVERY DAY    . INTRAROSA 6.5 MG INST INSERT 1 APPLICATORFUL INTRAVAGINALLY AT BEDTIME NIGHTLY.    phenazopyridine (PYRIDIUM) 200 MG tablet Take 1 tablet (200 mg total) by mouth 3 (three) times daily. 6 tablet 0  . progesterone (PROMETRIUM) 100 MG capsule Take 150 mg by mouth daily.     . Testosterone 2 MG/24HR PT24 Place onto the skin.    Marland Kitchen venlafaxine (EFFEXOR) 25 MG tablet Take 0.5 tablets (12.5 mg total) by mouth daily. Compound to total dose of 12.5mg  from capsule or tablet as per past history of taking it 45 tablet 2  . vitamin E 100 UNIT capsule Take 100 Units by mouth daily.     No current facility-administered medications for this visit.      Psychiatric Specialty Exam: Review of Systems  Cardiovascular: Negative for chest pain.  Psychiatric/Behavioral: Negative for dysphoric mood.    Last menstrual period 10/06/2013.There is no height or weight on file to calculate BMI.  General Appearance: Casual   Eye Contact:  Good  Speech:  Clear and Coherent  Volume:  Normal  Mood: fair  Affect:  Congruent  Thought Process:  Goal Directed  Orientation:  Full (Time, Place, and Person)  Thought Content:  Rumination  Suicidal Thoughts:  No  Homicidal Thoughts:  No  Memory:  Immediate;   Fair Recent;   Fair  Judgement:  Fair  Insight:  Fair  Psychomotor Activity:  Normal  Concentration:  Concentration: Fair and Attention Span: Fair  Recall:  Good  Fund of Knowledge:Good  Language: Good  Akathisia:  No  Handed:    AIMS (if indicated):  not done  Assets:  Communication Skills Desire for Improvement Financial Resources/Insurance Housing Physical Health  ADL's:  Intact  Cognition: WNL  Sleep:  variable   Screenings: Secondary school teacher Row Office Visit from 07/08/2018 in [facility] [place]  PHQ-2 Total Score 6  PHQ-9 Total Score 14    Flowsheet Row Video Visit [facility]2020 [facility] [place] Video Visit from 03/01/2020 in [facility] [place]  C-SSRS RISK CATEGORY No Risk No Risk      Assessment and Plan: as follows Prior documentation reviewed MDD recurrent moderate: Stable on Wellbutrin 150 mg Effexor small dose  GAD ; handling it fair continue small dose of Effexor  Insomnia: better on melatonin  Follow-up in 4 months renewed medication Effexor she gets Wellbutrin from her primary care Thresa Ross, MD 5/19/20229:41 AM

## 2020-09-09 ENCOUNTER — Ambulatory Visit: Payer: 59 | Admitting: Family Medicine

## 2020-09-13 ENCOUNTER — Ambulatory Visit: Payer: 59 | Admitting: Family Medicine

## 2020-09-18 ENCOUNTER — Ambulatory Visit: Payer: 59 | Admitting: Family Medicine

## 2020-09-24 ENCOUNTER — Ambulatory Visit (INDEPENDENT_AMBULATORY_CARE_PROVIDER_SITE_OTHER): Payer: 59 | Admitting: Family Medicine

## 2020-09-24 ENCOUNTER — Other Ambulatory Visit: Payer: Self-pay

## 2020-09-24 VITALS — BP 116/62 | HR 76 | Temp 97.8°F | Wt 121.6 lb

## 2020-09-24 DIAGNOSIS — B351 Tinea unguium: Secondary | ICD-10-CM | POA: Diagnosis not present

## 2020-09-24 MED ORDER — TERBINAFINE HCL 250 MG PO TABS: 250.0000 mg | ORAL_TABLET | Freq: Every day | ORAL | 0 refills | Status: DC

## 2020-09-24 NOTE — Progress Notes (Signed)
Established Patient Office Visit  Subjective:  Patient ID: Nancy Choi, female    DOB: 1965-12-12  Age: 55 y.o. MRN: [id number]  CC:  Chief Complaint  Patient presents with   Nail Problem    One nail on each foot, seen by Dermatology, meds given did not help at all    HPI Nancy Choi presents for possible onychomycosis with 1 toenail on each foot involved.  She actually has seen dermatologist for this.  They prescribed tolnaftate 1% topical and she used that for a few months and has not seen any improvement.  No associated pain.  Does see some abnormal thickening of the involved nails.  She has had some mild discoloration as well.  No history of diabetes.  No circulatory problems.  No history of liver problems.  Past Medical History:  Diagnosis Date   Allergy    DEPRESSION 08/14/2008   Qualifier: Diagnosis of  By: Gabriel Rung LPN, Nancy     Generalized anxiety disorder 08/14/2008   Qualifier: Diagnosis of  By: Gabriel Rung LPN, Doran Durand 08/14/2008   Qualifier: Diagnosis of  By: Gabriel Rung LPN, Harriett Sine      Past Surgical History:  Procedure Laterality Date   BREAST SURGERY      Family History  Problem Relation Age of Onset   Cancer Mother        breast   Depression Mother    Hyperlipidemia Father    Hypertension Father    Diabetes Paternal Grandmother    Stomach cancer Neg Hx    Rectal cancer Neg Hx    Esophageal cancer Neg Hx    Colon polyps Neg Hx    Colon cancer Neg Hx     Social History   Socioeconomic History   Marital status: Married    Spouse name: Not on file   Number of children: Not on file   Years of education: Not on file   Highest education level: Not on file  Occupational History   Not on file  Tobacco Use   Smoking status: Never   Smokeless tobacco: Never  Vaping Use   Vaping Use: Never used  Substance and Sexual Activity   Alcohol use: No    Alcohol/week: 0.0 standard drinks   Drug use: No   Sexual activity: Not on file  Other  Topics Concern   Not on file  Social History Narrative   Not on file   Social Determinants of Health   Financial Resource Strain: Not on file  Food Insecurity: Not on file  Transportation Needs: Not on file  Physical Activity: Not on file  Stress: Not on file  Social Connections: Not on file  Intimate Partner Violence: Not on file    Outpatient Medications Prior to Visit  Medication Sig Dispense Refill   ARMOUR THYROID 60 MG tablet Takes 60mg  in the morning and 60 mg at night.     Ascorbic Acid (VITAMIN C) 1000 MG tablet Take 1,000 mg by mouth daily.     benzocaine-resorcinol (VAGISIL) 5-2 % vaginal cream Place vaginally at bedtime. 60 g 0   buPROPion (WELLBUTRIN XL) 150 MG 24 hr tablet Take 1 tablet (150 mg total) by mouth daily. 90 tablet 3   Cholecalciferol (VITAMIN D) 2000 UNITS CAPS Take by mouth 3 (three) times a week.      estradiol (ESTRACE) 1 MG tablet TAKE ONE AND A HALF TABLET BY MOUTH EVERY DAY     INTRAROSA 6.5 MG INST  INSERT 1 APPLICATORFUL INTRAVAGINALLY AT BEDTIME NIGHTLY.     phenazopyridine (PYRIDIUM) 200 MG tablet Take 1 tablet (200 mg total) by mouth 3 (three) times daily. 6 tablet 0   progesterone (PROMETRIUM) 100 MG capsule Take 150 mg by mouth daily.      Testosterone 2 MG/24HR PT24 Place onto the skin.     venlafaxine (EFFEXOR) 25 MG tablet Take 0.5 tablets (12.5 mg total) by mouth daily. Compound to total dose of 12.5mg  from capsule or tablet as per past history of taking it 45 tablet 2   vitamin E 100 UNIT capsule Take 100 Units by mouth daily.     No facility-administered medications prior to visit.    Allergies  Allergen Reactions   Codeine Sulfate     REACTION: irritable, bugs crawling on skin sensation    ROS Review of Systems  Constitutional:  Negative for appetite change and unexpected weight change.     Objective:    Physical Exam Vitals reviewed.  Constitutional:      Appearance: Normal appearance.  Cardiovascular:     Rate and  Rhythm: Normal rate and regular rhythm.  Skin:    Comments: She has some mild discoloration and abnormal thickening and brittle changes involving left great toe and one toenail of her right foot as well.  Neurological:     Mental Status: She is alert.    BP 116/62 (BP Location: Left Arm, Patient Position: Sitting, Cuff Size: Normal)   Pulse 76   Temp 97.8 F (36.6 C) (Oral)   Wt 121 lb 9.6 oz (55.2 kg)   LMP 10/06/2013   SpO2 98%   BMI 18.49 kg/m  Wt Readings from Last 3 Encounters:  09/24/20 121 lb 9.6 oz (55.2 kg)  04/30/20 123 lb 3.2 oz (55.9 kg)  08/03/18 121 lb (54.9 kg)     Health Maintenance Due  Topic Date Due   HIV Screening  Never done   Zoster Vaccines- Shingrix (1 of 2) Never done   COVID-19 Vaccine (2 - Booster for Janssen series) 06/06/2019   PAP SMEAR-Modifier  07/01/2020   INFLUENZA VACCINE  08/12/2020    There are no preventive care reminders to display for this patient.  Lab Results  Component Value Date   TSH 0.02 (L) 04/30/2020   Lab Results  Component Value Date   WBC 6.7 04/30/2020   HGB 13.6 04/30/2020   HCT 40.7 04/30/2020   MCV 91.8 04/30/2020   PLT 210.0 04/30/2020   Lab Results  Component Value Date   NA 139 04/30/2020   K 4.2 04/30/2020   CO2 30 04/30/2020   GLUCOSE 85 04/30/2020   BUN 19 04/30/2020   CREATININE 0.75 04/30/2020   BILITOT 0.5 04/30/2020   ALKPHOS 78 04/30/2020   AST 15 04/30/2020   ALT 11 04/30/2020   PROT 6.7 04/30/2020   ALBUMIN 4.0 04/30/2020   CALCIUM 9.3 04/30/2020   GFR 89.65 04/30/2020   Lab Results  Component Value Date   CHOL 193 04/30/2020   Lab Results  Component Value Date   HDL 92.90 04/30/2020   Lab Results  Component Value Date   LDLCALC 93 04/30/2020   Lab Results  Component Value Date   TRIG 38.0 04/30/2020   Lab Results  Component Value Date   CHOLHDL 2 04/30/2020   No results found for: HGBA1C    Assessment & Plan:   Problem List Items Addressed This Visit    None Visit Diagnoses     Onychomycosis    -  Primary   Relevant Medications   terbinafine (LAMISIL) 250 MG tablet     Suspect she does have some onychomycosis versus less likely onycholysis.  She has not seen response to topical agent thus far and we explained that success rate with topicals is much less then with oral medication.  We also emphasized that with any treatment will take several months to see full new nail.  We did discuss possible treatment with Lamisil 250 mg daily for 3 months.  We reviewed potential side effects including low risk for liver dysfunction and other low risk side effects such as loss of taste and smell.  She is aware this will take several months to see significant effect  Meds ordered this encounter  Medications   terbinafine (LAMISIL) 250 MG tablet    Sig: Take 1 tablet (250 mg total) by mouth daily.    Dispense:  90 tablet    Refill:  0    Follow-up: No follow-ups on file.    Evelena Peat, MD

## 2020-09-30 ENCOUNTER — Telehealth (INDEPENDENT_AMBULATORY_CARE_PROVIDER_SITE_OTHER): Payer: 59 | Admitting: Psychiatry

## 2020-09-30 ENCOUNTER — Encounter (HOSPITAL_COMMUNITY): Payer: Self-pay | Admitting: Psychiatry

## 2020-09-30 DIAGNOSIS — F331 Major depressive disorder, recurrent, moderate: Secondary | ICD-10-CM

## 2020-09-30 DIAGNOSIS — F411 Generalized anxiety disorder: Secondary | ICD-10-CM

## 2020-09-30 MED ORDER — VENLAFAXINE HCL 25 MG PO TABS: 12.5000 mg | ORAL_TABLET | Freq: Every day | ORAL | 2 refills | Status: DC

## 2020-09-30 NOTE — Progress Notes (Signed)
[facility] Follow up visit Patient Identification: Nancy Choi MRN:  [id number] Date of Evaluation:  09/30/2020 Referral Source: primary care Chief Complaint:  mood symptoms, insomnia follow up  Visit Diagnosis:    ICD-10-CM   1. MDD (major depressive disorder), recurrent episode, moderate (HCC)  F33.1     2. GAD (generalized anxiety disorder)  F41.1       Virtual Visit via Video Note  I connected with [name] on 09/30/20 at  1:15 PM EDT by a video enabled telemedicine application and verified that I am speaking with the correct person using two identifiers.  Location: Patient: home Provider: home office   I discussed the limitations of evaluation and management by telemedicine and the availability of in person appointments. The patient expressed understanding and agreed to proceed.     I discussed the assessment and treatment plan with the patient. The patient was provided an opportunity to ask questions and all were answered. The patient agreed with the plan and demonstrated an understanding of the instructions.   The patient was advised to call back or seek an in-person evaluation if the symptoms worsen or if the condition fails to improve as anticipated.  I provided 12 minutes of non-face-to-face time during this encounter.      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 No kids Brother lives next door.   She is doing reasonable on a small dose of Effexor half of 25 mg and compounded form.  She is also on Wellbutrin  One of her cat died she will she is going through grief but overall doing manageable Has made her overseas trip of going to the that she enjoyed with her brother Some stress related with parents who are older     Aggravating factor:  Past relationship, husband death Modifying factor: job, cat Duration : adult life   Drug use denies Past psych admission or suicide attempt:  denies     Past Psychiatric History: depression, anxiety  Previous Psychotropic Medications: Yes     Past Medical History:  Past Medical History:  Diagnosis Date   Allergy    DEPRESSION 08/14/2008   Qualifier: Diagnosis of  By: Gabriel Rung LPN, Nancy     Generalized anxiety disorder 08/14/2008   Qualifier: Diagnosis of  By: Gabriel Rung LPN, Harriett Sine     HYPOTHYROIDISM 08/14/2008   Qualifier: Diagnosis of  By: Gabriel Rung LPN, Harriett Sine      Past Surgical History:  Procedure Laterality Date   BREAST SURGERY      Family Psychiatric History: mom: mood symptoms , depression  Family History:  Family History  Problem Relation Age of Onset   Cancer Mother        breast   Depression Mother    Hyperlipidemia Father    Hypertension Father    Diabetes Paternal Grandmother    Stomach cancer Neg Hx    Rectal cancer Neg Hx    Esophageal cancer Neg Hx    Colon polyps Neg Hx    Colon cancer Neg Hx     Social History:   Social History   Socioeconomic History   Marital status: Married    Spouse name: Not on file   Number of children: Not on file   Years of education: Not on file   Highest education level: Not on file  Occupational History   Not on file  Tobacco Use   Smoking status: Never   Smokeless  tobacco: Never  Vaping Use   Vaping Use: Never used  Substance and Sexual Activity   Alcohol use: No    Alcohol/week: 0.0 standard drinks   Drug use: No   Sexual activity: Not on file  Other Topics Concern   Not on file  Social History Narrative   Not on file   Social Determinants of Health   Financial Resource Strain: Not on file  Food Insecurity: Not on file  Transportation Needs: Not on file  Physical Activity: Not on file  Stress: Not on file  Social Connections: Not on file     Allergies:   Allergies  Allergen Reactions   Codeine Sulfate     REACTION: irritable, bugs crawling on skin sensation    Metabolic Disorder Labs: No results found for: HGBA1C, MPG No results  found for: PROLACTIN Lab Results  Component Value Date   CHOL 193 04/30/2020   TRIG 38.0 04/30/2020   HDL 92.90 04/30/2020   CHOLHDL 2 04/30/2020   VLDL 7.6 04/30/2020   LDLCALC 93 04/30/2020   LDLCALC 97 07/08/2018   Lab Results  Component Value Date   TSH 0.02 (L) 04/30/2020    Therapeutic Level Labs: No results found for: LITHIUM No results found for: CBMZ No results found for: VALPROATE  Current Medications: Current Outpatient Medications  Medication Sig Dispense Refill   ARMOUR THYROID 60 MG tablet Takes 60mg  in the morning and 60 mg at night.     Ascorbic Acid (VITAMIN C) 1000 MG tablet Take 1,000 mg by mouth daily.     benzocaine-resorcinol (VAGISIL) 5-2 % vaginal cream Place vaginally at bedtime. 60 g 0   buPROPion (WELLBUTRIN XL) 150 MG 24 hr tablet Take 1 tablet (150 mg total) by mouth daily. 90 tablet 3   Cholecalciferol (VITAMIN D) 2000 UNITS CAPS Take by mouth 3 (three) times a week.      estradiol (ESTRACE) 1 MG tablet TAKE ONE AND A HALF TABLET BY MOUTH EVERY DAY     INTRAROSA 6.5 MG INST INSERT 1 APPLICATORFUL INTRAVAGINALLY AT BEDTIME NIGHTLY.     phenazopyridine (PYRIDIUM) 200 MG tablet Take 1 tablet (200 mg total) by mouth 3 (three) times daily. 6 tablet 0   progesterone (PROMETRIUM) 100 MG capsule Take 150 mg by mouth daily.      terbinafine (LAMISIL) 250 MG tablet Take 1 tablet (250 mg total) by mouth daily. 90 tablet 0   Testosterone 2 MG/24HR PT24 Place onto the skin.     venlafaxine (EFFEXOR) 25 MG tablet Take 0.5 tablets (12.5 mg total) by mouth daily. Compound to total dose of 12.5mg  from capsule or tablet as per past history of taking it 45 tablet 2   vitamin E 100 UNIT capsule Take 100 Units by mouth daily.     No current facility-administered medications for this visit.      Psychiatric Specialty Exam: Review of Systems  Cardiovascular:  Negative for chest pain.  Psychiatric/Behavioral:  Negative for dysphoric mood.    Last menstrual period  10/06/2013.There is no height or weight on file to calculate BMI.  General Appearance: Casual  Eye Contact:  Good  Speech:  Clear and Coherent  Volume:  Normal  Mood: fair  Affect:  Congruent  Thought Process:  Goal Directed  Orientation:  Full (Time, Place, and Person)  Thought Content:  Rumination  Suicidal Thoughts:  No  Homicidal Thoughts:  No  Memory:  Immediate;   Fair Recent;   Fair  Judgement:  Fair  Insight:  Fair  Psychomotor Activity:  Normal  Concentration:  Concentration: Fair and Attention Span: Fair  Recall:  Good  Fund of Knowledge:Good  Language: Good  Akathisia:  No  Handed:    AIMS (if indicated):  not done  Assets:  Communication Skills Desire for Improvement Financial Resources/Insurance Housing Physical Health  ADL's:  Intact  Cognition: WNL  Sleep:   variable   Screenings: Oceanographer Row Office Visit from 07/08/2018 in [facility] [place]  PHQ-2 Total Score 6  PHQ-9 Total Score 14      Flowsheet Row [facility]2020 [facility] [place] Video Visit from 05[facility]30/2020 in [facility] [place] Video Visit from 03/01/2020 in [facility] [place]  C-SSRS RISK CATEGORY No Risk No Risk No Risk       Assessment and Plan: as follows Prior documentation reviewed MDD recurrent moderate: Remains manageable continue Wellbutrin and Effexor small dose  GAD ; handling it fair had been stressed because of her cats death Continue Effexor Insomnia: Did not endorse concerns  Follow-up in 6 months or earlier if needed  Thresa Ross, MD 9/19/20221:29 PM

## 2020-10-15 ENCOUNTER — Telehealth: Payer: Self-pay

## 2020-10-15 NOTE — Telephone Encounter (Signed)
Last Thursday. Was very fatigued over the weekend, yesterdays started itching around her hips. This morning itching around the hips, red marks around the knuckles. Upper lip and right eye are swollen this morning. Had a really bad headache for 3 days after receiving booster.   Please advise.

## 2020-10-15 NOTE — Telephone Encounter (Signed)
Patient called stating that she thinks she had an allergic reaction to a Covid booster shot and want to know if she needs to be seen pt requested a call back.

## 2020-10-15 NOTE — Telephone Encounter (Signed)
Spoke with the patient. She is aware of Dr. Lucie Leather message. She did want an appointment for this. Appointment has been scheduled.

## 2020-10-16 ENCOUNTER — Ambulatory Visit (INDEPENDENT_AMBULATORY_CARE_PROVIDER_SITE_OTHER): Payer: 59 | Admitting: Family Medicine

## 2020-10-16 ENCOUNTER — Other Ambulatory Visit: Payer: Self-pay

## 2020-10-16 VITALS — BP 108/60 | HR 58 | Temp 97.7°F | Wt 124.2 lb

## 2020-10-16 DIAGNOSIS — L509 Urticaria, unspecified: Secondary | ICD-10-CM | POA: Diagnosis not present

## 2020-10-16 MED ORDER — METHYLPREDNISOLONE ACETATE 80 MG/ML IJ SUSP
80.0000 mg | Freq: Once | INTRAMUSCULAR | Status: AC
  Administered 2020-10-16: 80 mg via INTRAMUSCULAR

## 2020-10-16 NOTE — Patient Instructions (Signed)
Continue with the Claritan  Consider adding H2 blocker such as Pepcid or Zantac twice daily  Let me know if not improving with the steroid shot.    Hold the Lamisil for now.

## 2020-10-16 NOTE — Progress Notes (Signed)
Established Patient Office Visit  Subjective:  Patient ID: Nancy Choi, female    DOB: 1965/06/26  Age: 55 y.o. MRN: [id number]  CC:  Chief Complaint  Patient presents with   Allergic Reaction    HPI VAN EHLERT presents for diffuse urticaria especially on her trunk and upper extremities and hip and waist region.  She states she got her COVID booster last Thursday.  Both Sunday she noticed urticarial lesions which were quite pruritic.  She also noticed a little bit of mild swelling of her upper lip yesterday.  No difficulty breathing.  No history of angioedema.  She was started on Lamisil about 3 weeks ago.  No Stevens-Johnson's type lesions or concerns.  She has been taking over-the-counter Claritin without much benefit.  Severe pruritus at times.  Denies any history of recurrent urticaria.  No change of soaps or detergents.  No known food allergies.  She is a Ambulance person.  No recent dietary changes.  Past Medical History:  Diagnosis Date   Allergy    DEPRESSION 08/14/2008   Qualifier: Diagnosis of  By: Gabriel Rung LPN, Nancy     Generalized anxiety disorder 08/14/2008   Qualifier: Diagnosis of  By: Gabriel Rung LPN, Doran Durand 08/14/2008   Qualifier: Diagnosis of  By: Gabriel Rung LPN, Harriett Sine      Past Surgical History:  Procedure Laterality Date   BREAST SURGERY      Family History  Problem Relation Age of Onset   Cancer Mother        breast   Depression Mother    Hyperlipidemia Father    Hypertension Father    Diabetes Paternal Grandmother    Stomach cancer Neg Hx    Rectal cancer Neg Hx    Esophageal cancer Neg Hx    Colon polyps Neg Hx    Colon cancer Neg Hx     Social History   Socioeconomic History   Marital status: Married    Spouse name: Not on file   Number of children: Not on file   Years of education: Not on file   Highest education level: Not on file  Occupational History   Not on file  Tobacco Use   Smoking status: Never   Smokeless  tobacco: Never  Vaping Use   Vaping Use: Never used  Substance and Sexual Activity   Alcohol use: No    Alcohol/week: 0.0 standard drinks   Drug use: No   Sexual activity: Not on file  Other Topics Concern   Not on file  Social History Narrative   Not on file   Social Determinants of Health   Financial Resource Strain: Not on file  Food Insecurity: Not on file  Transportation Needs: Not on file  Physical Activity: Not on file  Stress: Not on file  Social Connections: Not on file  Intimate Partner Violence: Not on file    Outpatient Medications Prior to Visit  Medication Sig Dispense Refill   ARMOUR THYROID 60 MG tablet Takes 60mg  in the morning and 60 mg at night.     Ascorbic Acid (VITAMIN C) 1000 MG tablet Take 1,000 mg by mouth daily.     benzocaine-resorcinol (VAGISIL) 5-2 % vaginal cream Place vaginally at bedtime. 60 g 0   buPROPion (WELLBUTRIN XL) 150 MG 24 hr tablet Take 1 tablet (150 mg total) by mouth daily. 90 tablet 3   Cholecalciferol (VITAMIN D) 2000 UNITS CAPS Take by mouth 3 (three) times a week.  estradiol (ESTRACE) 1 MG tablet TAKE ONE AND A HALF TABLET BY MOUTH EVERY DAY     INTRAROSA 6.5 MG INST INSERT 1 APPLICATORFUL INTRAVAGINALLY AT BEDTIME NIGHTLY.     phenazopyridine (PYRIDIUM) 200 MG tablet Take 1 tablet (200 mg total) by mouth 3 (three) times daily. 6 tablet 0   progesterone (PROMETRIUM) 100 MG capsule Take 150 mg by mouth daily.      terbinafine (LAMISIL) 250 MG tablet Take 1 tablet (250 mg total) by mouth daily. 90 tablet 0   Testosterone 2 MG/24HR PT24 Place onto the skin.     venlafaxine (EFFEXOR) 25 MG tablet Take 0.5 tablets (12.5 mg total) by mouth daily. Compound to total dose of 12.5mg  from capsule or tablet as per past history of taking it 45 tablet 2   vitamin E 100 UNIT capsule Take 100 Units by mouth daily.     No facility-administered medications prior to visit.    Allergies  Allergen Reactions   Codeine Sulfate     REACTION:  irritable, bugs crawling on skin sensation    ROS Review of Systems  Constitutional:  Negative for chills and fever.  HENT:  Negative for congestion and trouble swallowing.   Respiratory:  Negative for cough and shortness of breath.   Skin:  Positive for rash.     Objective:    Physical Exam Vitals reviewed.  Constitutional:      Appearance: Normal appearance.  Cardiovascular:     Rate and Rhythm: Normal rate and regular rhythm.  Pulmonary:     Effort: Pulmonary effort is normal.     Breath sounds: Normal breath sounds.  Skin:    Findings: Rash present.     Comments: She has multiple scattered urticaria including upper extremities, lower back, hip and waist region.  No significant edema involving her lips or tongue.  Neurological:     Mental Status: She is alert.    BP 108/60 (BP Location: Left Arm, Patient Position: Sitting, Cuff Size: Normal)   Pulse (!) 58   Temp 97.7 F (36.5 C) (Oral)   Wt 124 lb 3.2 oz (56.3 kg)   LMP 10/06/2013   SpO2 98%   BMI 18.88 kg/m  Wt Readings from Last 3 Encounters:  10/16/20 124 lb 3.2 oz (56.3 kg)  09/24/20 121 lb 9.6 oz (55.2 kg)  04/30/20 123 lb 3.2 oz (55.9 kg)     Health Maintenance Due  Topic Date Due   HIV Screening  Never done   Zoster Vaccines- Shingrix (1 of 2) Never done   COVID-19 Vaccine (2 - Booster for Janssen series) 06/06/2019   PAP SMEAR-Modifier  07/01/2020   INFLUENZA VACCINE  08/12/2020    There are no preventive care reminders to display for this patient.  Lab Results  Component Value Date   TSH 0.02 (L) 04/30/2020   Lab Results  Component Value Date   WBC 6.7 04/30/2020   HGB 13.6 04/30/2020   HCT 40.7 04/30/2020   MCV 91.8 04/30/2020   PLT 210.0 04/30/2020   Lab Results  Component Value Date   NA 139 04/30/2020   K 4.2 04/30/2020   CO2 30 04/30/2020   GLUCOSE 85 04/30/2020   BUN 19 04/30/2020   CREATININE 0.75 04/30/2020   BILITOT 0.5 04/30/2020   ALKPHOS 78 04/30/2020   AST 15  04/30/2020   ALT 11 04/30/2020   PROT 6.7 04/30/2020   ALBUMIN 4.0 04/30/2020   CALCIUM 9.3 04/30/2020   GFR 89.65 04/30/2020  Lab Results  Component Value Date   CHOL 193 04/30/2020   Lab Results  Component Value Date   HDL 92.90 04/30/2020   Lab Results  Component Value Date   LDLCALC 93 04/30/2020   Lab Results  Component Value Date   TRIG 38.0 04/30/2020   Lab Results  Component Value Date   CHOLHDL 2 04/30/2020   No results found for: HGBA1C    Assessment & Plan:   Problem List Items Addressed This Visit   None Visit Diagnoses     Urticaria    -  Primary   Relevant Medications   methylPREDNISolone acetate (DEPO-MEDROL) injection 80 mg (Start on 10/16/2020  9:45 AM)     Etiology unclear.  She is concerned this is related to COVID booster last Thursday although urticaria did not start till Sunday.  She was recently about 3 weeks ago initiated on Lamisil as well.  -Hold the Lamisil for now -Continue Claritin -Add H2 blocker such as Pepcid or Zantac twice daily -Depo-Medrol 80 mg IM given -Be in touch if urticaria not resolving over the next few days or if she has recurrence  Meds ordered this encounter  Medications   methylPREDNISolone acetate (DEPO-MEDROL) injection 80 mg    Follow-up: No follow-ups on file.    Evelena Peat, MD

## 2020-10-17 ENCOUNTER — Telehealth: Payer: Self-pay | Admitting: Family Medicine

## 2020-10-17 NOTE — Telephone Encounter (Signed)
Spoke with the patient she would like to know if she can get the covid booster and flu shot at the same time since she had covid in July. Please advise.

## 2020-10-17 NOTE — Telephone Encounter (Signed)
Spoke with the patient. She is aware that Dr. Caryl Never does not recommend she get the covid booster due to a recent reaction to the last booster she received.

## 2020-10-17 NOTE — Telephone Encounter (Signed)
Patient is requesting a phone call back from [facility] regarding her visit from yesterday 10/16/2020.  Patient could be contacted at [phone].  Please advise.

## 2020-10-17 NOTE — Telephone Encounter (Signed)
Left message for patient to call back  

## 2020-11-05 ENCOUNTER — Encounter: Payer: Self-pay | Admitting: Family Medicine

## 2020-12-11 ENCOUNTER — Encounter: Payer: Self-pay | Admitting: Family Medicine

## 2020-12-12 NOTE — Telephone Encounter (Signed)
Access Nurse Call 12/11/20 at 1618  Caller states she injured her back Monday morning and is experiencing nausea and burning. -Caller states back injury that occured monday; states now having generalized abd pain, lower back pain that feels like burning, also dizziness.  12/11/2020 4:26:18 PM SEE PCP WITHIN 3 DAYS  Danae Orleans, RN, [place]  Referrals REFERRED TO PCP OFFICE  12/12/20 1123: LVM instructions requesting pt to call back to schedule appt. Appt can be scheduled with any available provider if PCP has no availability

## 2020-12-13 ENCOUNTER — Other Ambulatory Visit (HOSPITAL_COMMUNITY): Payer: Self-pay | Admitting: Obstetrics and Gynecology

## 2020-12-13 DIAGNOSIS — Z9189 Other specified personal risk factors, not elsewhere classified: Secondary | ICD-10-CM

## 2020-12-13 DIAGNOSIS — R921 Mammographic calcification found on diagnostic imaging of breast: Secondary | ICD-10-CM

## 2021-01-16 ENCOUNTER — Ambulatory Visit
Admission: RE | Admit: 2021-01-16 | Discharge: 2021-01-16 | Disposition: A | Discharge status: Home / Self Care | Payer: 59 | Source: Ambulatory Visit | Attending: Obstetrics and Gynecology | Admitting: Obstetrics and Gynecology

## 2021-01-16 DIAGNOSIS — R921 Mammographic calcification found on diagnostic imaging of breast: Secondary | ICD-10-CM

## 2021-01-23 ENCOUNTER — Encounter (HOSPITAL_COMMUNITY): Payer: Self-pay

## 2021-01-23 ENCOUNTER — Ambulatory Visit (HOSPITAL_COMMUNITY): Payer: 59

## 2021-02-05 ENCOUNTER — Other Ambulatory Visit: Payer: Self-pay | Admitting: Obstetrics and Gynecology

## 2021-02-05 DIAGNOSIS — Z9189 Other specified personal risk factors, not elsewhere classified: Secondary | ICD-10-CM

## 2021-03-24 ENCOUNTER — Telehealth (HOSPITAL_COMMUNITY): Payer: 59 | Admitting: Psychiatry

## 2021-03-31 ENCOUNTER — Telehealth (HOSPITAL_COMMUNITY): Payer: 59 | Admitting: Psychiatry

## 2021-04-02 ENCOUNTER — Encounter (HOSPITAL_COMMUNITY): Payer: Self-pay | Admitting: Psychiatry

## 2021-04-02 ENCOUNTER — Telehealth (INDEPENDENT_AMBULATORY_CARE_PROVIDER_SITE_OTHER): Payer: 59 | Admitting: Psychiatry

## 2021-04-02 DIAGNOSIS — F411 Generalized anxiety disorder: Secondary | ICD-10-CM | POA: Diagnosis not present

## 2021-04-02 DIAGNOSIS — F331 Major depressive disorder, recurrent, moderate: Secondary | ICD-10-CM

## 2021-04-02 DIAGNOSIS — F5102 Adjustment insomnia: Secondary | ICD-10-CM

## 2021-04-02 MED ORDER — VENLAFAXINE HCL 25 MG PO TABS: 12.5000 mg | ORAL_TABLET | Freq: Every day | ORAL | 2 refills | Status: DC

## 2021-04-02 MED ORDER — BUPROPION HCL ER (XL) 150 MG PO TB24: 150.0000 mg | ORAL_TABLET | Freq: Every day | ORAL | 0 refills | Status: DC

## 2021-04-02 NOTE — Progress Notes (Signed)
[place] Follow up visit ?Patient Identification: Nancy Choi ?MRN:  [id number] ?Date of Evaluation:  04/02/2021 ?Referral Source: primary care ?Chief Complaint:  mood symptoms, insomnia follow up  ?Visit Diagnosis:  ?  ICD-10-CM   ?1. MDD (major depressive disorder), recurrent episode, moderate (HCC)  F33.1   ?  ?2. GAD (generalized anxiety disorder)  F41.1   ?  ?3. Adjustment insomnia  F51.02   ?  ? ?Virtual Visit via Video Note ? ?I connected with Nancy Choi on 04/02/21 at 11:30 AM EDT by a video enabled telemedicine application and verified that I am speaking with the correct person using two identifiers. ? ?Location: ?Patient: home ?Provider: home office ?  ?I discussed the limitations of evaluation and management by telemedicine and the availability of in person appointments. The patient expressed understanding and agreed to proceed. ? ? ? ?  ?I discussed the assessment and treatment plan with the patient. The patient was provided an opportunity to ask questions and all were answered. The patient agreed with the plan and demonstrated an understanding of the instructions. ?  ?The patient was advised to call back or seek an in-person evaluation if the symptoms worsen or if the condition fails to improve as anticipated. ? ?I provided 15 minutes of non-face-to-face time during this encounter. ? ? ? 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  ? ?Has been doing fair but spring brings memories of her husbands death and sunburn when teenager ?She keeps busy with work and yard work ? ?Tolerating meds ? ? ?Some stress related with parents who are older ? ? ? ? ?Aggravating factor:  Past relationship, husband death ?Modifying factor: job, cat ?Duration :adult life ?Severity : spring brings subdued mood ?Drug use denies ?Past psych admission or suicide attempt: denies ? ? ? ? ?Past Psychiatric History: depression,  anxiety ? ?Previous Psychotropic Medications: Yes  ? ? ? ?Past Medical History:  ?Past Medical History:  ?Diagnosis Date  ? Allergy   ? DEPRESSION 08/14/2008  ? Qualifier: Diagnosis of  By: Valma Cava LPN, Izora Gala    ? Generalized anxiety disorder 08/14/2008  ? Qualifier: Diagnosis of  By: Valma Cava LPN, Izora Gala    ? HYPOTHYROIDISM 08/14/2008  ? Qualifier: Diagnosis of  By: Valma Cava LPN, Izora Gala    ?  ?Past Surgical History:  ?Procedure Laterality Date  ? BREAST LUMPECTOMY Right   ? 1985 benign  ? BREAST SURGERY    ? ? ?Family Psychiatric History: mom: mood symptoms , depression ? ?Family History:  ?Family History  ?Problem Relation Age of Onset  ? Breast cancer Mother 70  ?     metastic (-) BrCA gene. blood work shows ATM mutation in [place] (2022)  ? Depression Mother   ? Hyperlipidemia Father   ? Hypertension Father   ? Diabetes Paternal Grandmother   ? Breast cancer Cousin 50  ? Stomach cancer Neg Hx   ? Rectal cancer Neg Hx   ? Esophageal cancer Neg Hx   ? Colon polyps Neg Hx   ? Colon cancer Neg Hx   ? ? ?Social History:   ?Social History  ? ?Socioeconomic History  ? Marital status: Married  ?  Spouse name: Not on file  ? Number of children: Not on file  ? Years of education: Not on file  ? Highest education level: Not on file  ?Occupational History  ? Not on file  ?  Tobacco Use  ? Smoking status: Never  ? Smokeless tobacco: Never  ?Vaping Use  ? Vaping Use: Never used  ?Substance and Sexual Activity  ? Alcohol use: No  ?  Alcohol/week: 0.0 standard drinks  ? Drug use: No  ? Sexual activity: Not on file  ?Other Topics Concern  ? Not on file  ?Social History Narrative  ? Not on file  ? ?Social Determinants of Health  ? ?Financial Resource Strain: Not on file  ?Food Insecurity: Not on file  ?Transportation Needs: Not on file  ?Physical Activity: Not on file  ?Stress: Not on file  ?Social Connections: Not on file  ? ? ? ?Allergies:   ?Allergies  ?Allergen Reactions  ? Codeine Sulfate   ?  REACTION: irritable, bugs crawling on skin  sensation  ? ? ?Metabolic Disorder Labs: ?No results found for: HGBA1C, MPG ?No results found for: PROLACTIN ?Lab Results  ?Component Value Date  ? CHOL 193 04/30/2020  ? TRIG 38.0 04/30/2020  ? HDL 92.90 04/30/2020  ? CHOLHDL 2 04/30/2020  ? VLDL 7.6 04/30/2020  ? [place] 93 04/30/2020  ? [place] 97 07/08/2018  ? ?Lab Results  ?Component Value Date  ? TSH 0.02 (L) 04/30/2020  ? ? ?Therapeutic Level Labs: ?No results found for: LITHIUM ?No results found for: CBMZ ?No results found for: VALPROATE ? ?Current Medications: ?Current Outpatient Medications  ?Medication Sig Dispense Refill  ? ARMOUR THYROID 60 MG tablet Takes 41m in the morning and 60 mg at night.    ? Ascorbic Acid (VITAMIN C) 1000 MG tablet Take 1,000 mg by mouth daily.    ? benzocaine-resorcinol (VAGISIL) 5-2 % vaginal cream Place vaginally at bedtime. 60 g 0  ? buPROPion (WELLBUTRIN XL) 150 MG 24 hr tablet Take 1 tablet (150 mg total) by mouth daily. 90 tablet 0  ? Cholecalciferol (VITAMIN D) 2000 UNITS CAPS Take by mouth 3 (three) times a week.     ? estradiol (ESTRACE) 1 MG tablet TAKE ONE AND A HALF TABLET BY MOUTH EVERY DAY    ? INTRAROSA 6.5 MG INST INSERT 1 APPLICATORFUL INTRAVAGINALLY AT BEDTIME NIGHTLY.    ? phenazopyridine (PYRIDIUM) 200 MG tablet Take 1 tablet (200 mg total) by mouth 3 (three) times daily. 6 tablet 0  ? progesterone (PROMETRIUM) 100 MG capsule Take 150 mg by mouth daily.     ? terbinafine (LAMISIL) 250 MG tablet Take 1 tablet (250 mg total) by mouth daily. 90 tablet 0  ? Testosterone 2 MG/24HR PT24 Place onto the skin.    ? venlafaxine (EFFEXOR) 25 MG tablet Take 0.5 tablets (12.5 mg total) by mouth daily. Compound to total dose of 12.529mfrom capsule or tablet as per past history of taking it 45 tablet 2  ? vitamin E 100 UNIT capsule Take 100 Units by mouth daily.    ? ?No current facility-administered medications for this visit.  ? ? ? ? ?Psychiatric Specialty Exam: ?Review of Systems  ?Last menstrual period  10/06/2013.There is no height or weight on file to calculate BMI.  ?General Appearance: Casual  ?Eye Contact:  Good  ?Speech:  Clear and Coherent  ?Volume:  Normal  ?Mood: fair but somewhat subdued  ?Affect:  Congruent  ?Thought Process:  Goal Directed  ?Orientation:  Full (Time, Place, and Person)  ?Thought Content:  Rumination  ?Suicidal Thoughts:  No  ?Homicidal Thoughts:  No  ?Memory:  Immediate;   Fair ?Recent;   Fair  ?Judgement:  Fair  ?Insight:  Fair  ?Psychomotor Activity:  Normal  ?Concentration:  Concentration: Fair and Attention Span: Fair  ?Recall:  Good  ?[place] [employer] [place]  ?Language: Good  ?Akathisia:  No  ?Handed:    ?AIMS (if indicated):  not done  ?Assets:  Communication Skills ?Desire for Improvement ?Financial Resources/Insurance ?Housing ?Physical Health  ?ADL's:  Intact  ?Cognition: WNL  ?Sleep:   variable  ? ?Screenings: ?PHQ2-9   ? ?[place] Office Visit from 07/08/2018 in [facility] at [facility]  ?PHQ-2 Total Score 6  ?PHQ-9 Total Score 14  ? ?  ? ?Flowsheet Row Video Visit from 04/02/2021 in [facility] Video Visit from 09/30/2020 in [facility] Video Visit from 05/30/2020 in [facility]  ?C-SSRS RISK CATEGORY No Risk No Risk No Risk  ? ?  ? ? ?Assessment and Plan: as follows ? ?Prior documentation reviewed ? ?MDD recurrent moderate: somewhat subdued but she is ok with meds, would want to add therapy , will refer and she will call ?Continue wellbutrin ? ?GAD ;fair continue low dose effexor ?Insomnia: wakes up early but somewhat hard to get started, sleep hours are adequate, reviewed sleep hygiene ? ?Fu 73m Meds renewed ?NMerian Capron MD ?3/22/202311:41 AM ?

## 2021-04-23 ENCOUNTER — Other Ambulatory Visit: Payer: 59

## 2021-04-30 ENCOUNTER — Ambulatory Visit (HOSPITAL_COMMUNITY): Payer: 59 | Admitting: Licensed Clinical Social Worker

## 2021-05-27 ENCOUNTER — Ambulatory Visit
Admission: RE | Admit: 2021-05-27 | Discharge: 2021-05-27 | Disposition: A | Discharge status: Home / Self Care | Payer: 59 | Source: Ambulatory Visit | Attending: Obstetrics and Gynecology | Admitting: Obstetrics and Gynecology

## 2021-05-27 DIAGNOSIS — Z9189 Other specified personal risk factors, not elsewhere classified: Secondary | ICD-10-CM

## 2021-05-27 MED ORDER — GADOBUTROL 1 MMOL/ML IV SOLN
6.0000 mL | Freq: Once | INTRAVENOUS | Status: AC | PRN
  Administered 2021-05-27: 6 mL via INTRAVENOUS

## 2021-07-21 ENCOUNTER — Telehealth (HOSPITAL_COMMUNITY): Payer: 59 | Admitting: Psychiatry

## 2021-07-30 ENCOUNTER — Encounter: Payer: Self-pay | Admitting: Family Medicine

## 2021-07-30 ENCOUNTER — Ambulatory Visit (INDEPENDENT_AMBULATORY_CARE_PROVIDER_SITE_OTHER): Payer: 59 | Admitting: Family Medicine

## 2021-07-30 VITALS — BP 100/66 | HR 82 | Temp 97.5°F | Ht 68.0 in | Wt 124.9 lb

## 2021-07-30 DIAGNOSIS — R5383 Other fatigue: Secondary | ICD-10-CM

## 2021-07-30 DIAGNOSIS — K219 Gastro-esophageal reflux disease without esophagitis: Secondary | ICD-10-CM | POA: Diagnosis not present

## 2021-07-30 DIAGNOSIS — R42 Dizziness and giddiness: Secondary | ICD-10-CM | POA: Diagnosis not present

## 2021-07-30 LAB — CBC WITH DIFFERENTIAL/PLATELET
Basophils Absolute: 0.1 10*3/uL (ref 0.0–0.1)
Basophils Relative: 0.8 % (ref 0.0–3.0)
Eosinophils Absolute: 0.1 10*3/uL (ref 0.0–0.7)
Eosinophils Relative: 0.7 % (ref 0.0–5.0)
HCT: 40.6 % (ref 36.0–46.0)
Hemoglobin: 13.4 g/dL (ref 12.0–15.0)
Lymphocytes Relative: 30.7 % (ref 12.0–46.0)
Lymphs Abs: 3 10*3/uL (ref 0.7–4.0)
MCHC: 33 g/dL (ref 30.0–36.0)
MCV: 92.2 fl (ref 78.0–100.0)
Monocytes Absolute: 0.7 10*3/uL (ref 0.1–1.0)
Monocytes Relative: 7.2 % (ref 3.0–12.0)
Neutro Abs: 6 10*3/uL (ref 1.4–7.7)
Neutrophils Relative %: 60.6 % (ref 43.0–77.0)
Platelets: 212 10*3/uL (ref 150.0–400.0)
RBC: 4.41 Mil/uL (ref 3.87–5.11)
RDW: 13.5 % (ref 11.5–15.5)
WBC: 9.9 10*3/uL (ref 4.0–10.5)

## 2021-07-30 LAB — COMPREHENSIVE METABOLIC PANEL
ALT: 14 U/L (ref 0–35)
AST: 19 U/L (ref 0–37)
Albumin: 4.1 g/dL (ref 3.5–5.2)
Alkaline Phosphatase: 76 U/L (ref 39–117)
BUN: 19 mg/dL (ref 6–23)
CO2: 31 mEq/L (ref 19–32)
Calcium: 9.3 mg/dL (ref 8.4–10.5)
Chloride: 101 mEq/L (ref 96–112)
Creatinine, Ser: 0.7 mg/dL (ref 0.40–1.20)
GFR: 96.54 mL/min (ref >60.00)
Glucose, Bld: 91 mg/dL (ref 70–99)
Potassium: 3.8 mEq/L (ref 3.5–5.1)
Sodium: 138 mEq/L (ref 135–145)
Total Bilirubin: 0.4 mg/dL (ref 0.2–1.2)
Total Protein: 7 g/dL (ref 6.0–8.3)

## 2021-07-30 NOTE — Progress Notes (Signed)
Established Patient Office Visit  Subjective   Patient ID: Nancy Choi, female    DOB: Jan 07, 1966  Age: 56 y.o. MRN: [id number]  Chief Complaint  Patient presents with   Dizziness    Patient complains of vertigo, x1 weeks   Gastroesophageal Reflux    HPI   Jazlynn recently saw her gynecologist.  She was treated by GYN with Flagyl.  Last Thursday she developed severe dizziness with vertigo type symptoms.  She has had some persistent lightheadedness since then.  She also has had metallic taste.  She had fairly severe nausea but no vomiting.  Still has somewhat poor appetite and keeping down fluids and eating okay.  She also had increased GERD symptoms which have persisted somewhat since late last week.  He is now off the Flagyl.  She is concerned now predominantly because of the lightheadedness.  Denies any recent speech changes, visual changes, focal weakness, ataxia, or any acute hearing changes.  She does have hypothyroidism and is on replacement and followed through integrative medicine practice.  She states she had thyroid blood work in June which was stable.  She does have history of generalized anxiety and is on Effexor and Wellbutrin has been on that for some time.  No recent skip doses.  No diarrhea.  Weight stable.  No fever.  Past Medical History:  Diagnosis Date   Allergy    DEPRESSION 08/14/2008   Qualifier: Diagnosis of  By: Gabriel Rung LPN, Nancy     Generalized anxiety disorder 08/14/2008   Qualifier: Diagnosis of  By: Gabriel Rung LPN, Harriett Sine     HYPOTHYROIDISM 08/14/2008   Qualifier: Diagnosis of  By: Gabriel Rung LPN, Harriett Sine     Past Surgical History:  Procedure Laterality Date   BREAST LUMPECTOMY Right    1985 benign   BREAST SURGERY      reports that she has never smoked. She has never used smokeless tobacco. She reports that she does not drink alcohol and does not use drugs. family history includes Breast cancer (age of onset: 16) in her cousin; Breast cancer (age of onset: 101)  in her mother; Depression in her mother; Diabetes in her paternal grandmother; Hyperlipidemia in her father; Hypertension in her father. Allergies  Allergen Reactions   Flagyl [Metronidazole] Nausea Only   Codeine Sulfate     REACTION: irritable, bugs crawling on skin sensation    Review of Systems  Constitutional:  Positive for malaise/fatigue. Negative for chills and fever.  Respiratory:  Negative for cough and shortness of breath.   Cardiovascular:  Negative for chest pain.  Gastrointestinal:  Positive for heartburn.  Neurological:  Positive for dizziness. Negative for tremors, focal weakness and headaches.      Objective:     BP 100/66 (BP Location: Left Arm, Patient Position: Sitting, Cuff Size: Normal)   Pulse 82   Temp (!) 97.5 F (36.4 C) (Oral)   Ht 5\' 8"  (1.727 m)   Wt 124 lb 14.4 oz (56.7 kg)   LMP 10/06/2013   SpO2 98%   BMI 18.99 kg/m  BP Readings from Last 3 Encounters:  07/30/21 100/66  10/16/20 108/60  09/24/20 116/62   Wt Readings from Last 3 Encounters:  07/30/21 124 lb 14.4 oz (56.7 kg)  10/16/20 124 lb 3.2 oz (56.3 kg)  09/24/20 121 lb 9.6 oz (55.2 kg)      Physical Exam Vitals reviewed.  Constitutional:      General: She is not in acute distress.    Appearance: Normal  appearance. She is not toxic-appearing.  HENT:     Head: Normocephalic and atraumatic.  Eyes:     Extraocular Movements: Extraocular movements intact.     Pupils: Pupils are equal, round, and reactive to light.  Cardiovascular:     Rate and Rhythm: Normal rate and regular rhythm.  Pulmonary:     Effort: Pulmonary effort is normal.     Breath sounds: Normal breath sounds. No wheezing or rales.  Musculoskeletal:     Cervical back: Neck supple.  Neurological:     General: No focal deficit present.     Mental Status: She is alert and oriented to person, place, and time.     Cranial Nerves: No cranial nerve deficit.     Motor: No weakness.     Coordination: Coordination  normal.     Gait: Gait normal.  Psychiatric:        Mood and Affect: Mood normal.      No results found for any visits on 07/30/21.    The 10-year ASCVD risk score (Arnett DK, et al., 2019) is: 0.8%    Assessment & Plan:   Problem List Items Addressed This Visit   None Visit Diagnoses     Dizziness    -  Primary   Relevant Orders   CBC with Differential/Platelet   CMP   Fatigue, unspecified type       Relevant Orders   CBC with Differential/Platelet   CMP   Gastroesophageal reflux disease, unspecified whether esophagitis present         Patient relates onset last week of acute vertigo which was somewhat transient.  She now presents with persistent lightheadedness.  Blood pressure supine 100/60, seated 96/60, and standing 90/58.  No tachycardia.  Nonfocal exam.  She developed some metallic taste and persistent lightheadedness.  Metallic taste very likely related to recent Flagyl.  -Stressed importance of good hydration which she appears to be doing -Check CBC and CMP -Follow-up immediately for any new neurologic symptoms or any persistent symptoms -Suggested short-term trial of Prilosec or Nexium 20 mg daily for her recent GERD symptoms  No follow-ups on file.    Evelena Peat, MD

## 2021-08-01 ENCOUNTER — Telehealth: Payer: Self-pay | Admitting: Family Medicine

## 2021-08-01 NOTE — Telephone Encounter (Signed)
Please see result note 

## 2021-08-01 NOTE — Telephone Encounter (Signed)
Pt called returning CMA's call. CMA was unavailable. Pt asked for a call back.

## 2021-08-04 ENCOUNTER — Telehealth: Payer: Self-pay | Admitting: Family Medicine

## 2021-08-04 NOTE — Telephone Encounter (Signed)
Please see result note 

## 2021-08-04 NOTE — Telephone Encounter (Signed)
Pt returning call from earlier today for results

## 2021-08-05 NOTE — Telephone Encounter (Signed)
Please see result note 

## 2021-08-05 NOTE — Telephone Encounter (Signed)
Pt is returning mykal call 

## 2021-08-11 ENCOUNTER — Other Ambulatory Visit (HOSPITAL_COMMUNITY): Payer: Self-pay | Admitting: Psychiatry

## 2021-08-14 ENCOUNTER — Telehealth (HOSPITAL_COMMUNITY): Payer: Self-pay | Admitting: *Deleted

## 2021-08-14 NOTE — Telephone Encounter (Signed)
Patient called to Reschedule 08/15/21 Appt "stated that she had to pick up a Dog for someone"

## 2021-08-15 ENCOUNTER — Telehealth (HOSPITAL_COMMUNITY): Payer: 59 | Admitting: Psychiatry

## 2021-08-31 ENCOUNTER — Encounter: Payer: Self-pay | Admitting: Family Medicine

## 2021-08-31 DIAGNOSIS — K219 Gastro-esophageal reflux disease without esophagitis: Secondary | ICD-10-CM

## 2021-08-31 DIAGNOSIS — R1010 Upper abdominal pain, unspecified: Secondary | ICD-10-CM

## 2021-09-01 ENCOUNTER — Encounter (HOSPITAL_COMMUNITY): Payer: Self-pay | Admitting: Psychiatry

## 2021-09-01 ENCOUNTER — Telehealth (INDEPENDENT_AMBULATORY_CARE_PROVIDER_SITE_OTHER): Payer: 59 | Admitting: Psychiatry

## 2021-09-01 DIAGNOSIS — F331 Major depressive disorder, recurrent, moderate: Secondary | ICD-10-CM | POA: Diagnosis not present

## 2021-09-01 DIAGNOSIS — F411 Generalized anxiety disorder: Secondary | ICD-10-CM

## 2021-09-01 DIAGNOSIS — F5102 Adjustment insomnia: Secondary | ICD-10-CM

## 2021-09-01 MED ORDER — VENLAFAXINE HCL 25 MG PO TABS: 12.5000 mg | ORAL_TABLET | Freq: Every day | ORAL | 0 refills | Status: DC

## 2021-09-01 NOTE — Progress Notes (Signed)
[place] Follow up visit Patient Identification: Nancy Choi MRN:  [id number] Date of Evaluation:  09/01/2021 Referral Source: primary care Chief Complaint:  mood symptoms, insomnia follow up  Visit Diagnosis:    ICD-10-CM   1. MDD (major depressive disorder), recurrent episode, moderate (HCC)  F33.1     2. GAD (generalized anxiety disorder)  F41.1     3. Adjustment insomnia  F51.02      Virtual Visit via Video Note  I connected with Nancy Choi on 09/01/21 at  2:00 PM EDT by a video enabled telemedicine application and verified that I am speaking with the correct person using two identifiers.  Location: Patient: work Provider: home office   I discussed the limitations of evaluation and management by telemedicine and the availability of in person appointments. The patient expressed understanding and agreed to proceed.    I discussed the assessment and treatment plan with the patient. The patient was provided an opportunity to ask questions and all were answered. The patient agreed with the plan and demonstrated an understanding of the instructions.   The patient was advised to call back or seek an in-person evaluation if the symptoms worsen or if the condition fails to improve as anticipated.  I provided 15 minutes of non-face-to-face time during this encounter.         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 No kids Brother lives next door.   Has been having GI symptoms getting evaluated was feeling dizzy, nausea Overall managing stress altough job stress can be difficult at times   She keeps busy at home as well  Aggravating factor:  Past relationship, husband death, job stress Modifying factor:  BF, cats Duration :adult life Severity :manageable Past psych admission or suicide attempt: denies     Past Psychiatric History: depression, anxiety  Previous Psychotropic Medications: Yes      Past Medical History:  Past Medical History:  Diagnosis Date   Allergy    DEPRESSION 08/14/2008   Qualifier: Diagnosis of  By: Valma Cava LPN, Nancy     Generalized anxiety disorder 08/14/2008   Qualifier: Diagnosis of  By: Valma Cava LPN, Izora Gala     HYPOTHYROIDISM 08/14/2008   Qualifier: Diagnosis of  By: Valma Cava LPN, Izora Gala      Past Surgical History:  Procedure Laterality Date   BREAST LUMPECTOMY Right    1985 benign   BREAST SURGERY      Family Psychiatric History: mom: mood symptoms , depression  Family History:  Family History  Problem Relation Age of Onset   Breast cancer Mother 64       metastic (-) BrCA gene. blood work shows ATM mutation in [place] (2022)   Depression Mother    Hyperlipidemia Father    Hypertension Father    Diabetes Paternal Grandmother    Breast cancer Cousin 47   Stomach cancer Neg Hx    Rectal cancer Neg Hx    Esophageal cancer Neg Hx    Colon polyps Neg Hx    Colon cancer Neg Hx     Social History:   Social History   Socioeconomic History   Marital status: Married    Spouse name: Not on file   Number of children: Not on file   Years of education: Not on file   Highest education level: Not on file  Occupational History   Not on file  Tobacco Use   Smoking status: Never  Smokeless tobacco: Never  Vaping Use   Vaping Use: Never used  Substance and Sexual Activity   Alcohol use: No    Alcohol/week: 0.0 standard drinks of alcohol   Drug use: No   Sexual activity: Not on file  Other Topics Concern   Not on file  Social History Narrative   Not on file   Social Determinants of Health   Financial Resource Strain: Not on file  Food Insecurity: Not on file  Transportation Needs: Not on file  Physical Activity: Not on file  Stress: Not on file  Social Connections: Not on file     Allergies:   Allergies  Allergen Reactions   Flagyl [Metronidazole] Nausea Only   Codeine Sulfate     REACTION: irritable, bugs crawling on skin  sensation    Metabolic Disorder Labs: No results found for: "HGBA1C", "MPG" No results found for: "PROLACTIN" Lab Results  Component Value Date   CHOL 193 04/30/2020   TRIG 38.0 04/30/2020   HDL 92.90 04/30/2020   CHOLHDL 2 04/30/2020   VLDL 7.6 04/30/2020   LDLCALC 93 04/30/2020   LDLCALC 97 07/08/2018   Lab Results  Component Value Date   TSH 0.02 (L) 04/30/2020    Therapeutic Level Labs: No results found for: "LITHIUM" No results found for: "CBMZ" No results found for: "VALPROATE"  Current Medications: Current Outpatient Medications  Medication Sig Dispense Refill   ARMOUR THYROID 60 MG tablet Takes 65m in the morning and 60 mg at night.     Ascorbic Acid (VITAMIN C) 1000 MG tablet Take 1,000 mg by mouth daily.     benzocaine-resorcinol (VAGISIL) 5-2 % vaginal cream Place vaginally at bedtime. 60 g 0   buPROPion (WELLBUTRIN XL) 150 MG 24 hr tablet Take 1 tablet (150 mg total) by mouth daily. 90 tablet 0   Cholecalciferol (VITAMIN D) 2000 UNITS CAPS Take by mouth 3 (three) times a week.      estradiol (ESTRACE) 1 MG tablet TAKE ONE AND A HALF TABLET BY MOUTH EVERY DAY     INTRAROSA 6.5 MG INST INSERT 1 APPLICATORFUL INTRAVAGINALLY AT BEDTIME NIGHTLY.     progesterone (PROMETRIUM) 100 MG capsule Take 150 mg by mouth daily.      venlafaxine (EFFEXOR) 25 MG tablet Take 0.5 tablets (12.5 mg total) by mouth daily. Compound to total dose of 12.551mfrom capsule or tablet as per past history of taking it 45 tablet 0   vitamin E 100 UNIT capsule Take 100 Units by mouth daily.     No current facility-administered medications for this visit.      Psychiatric Specialty Exam: Review of Systems  Cardiovascular:  Negative for chest pain.  Psychiatric/Behavioral:  Negative for agitation.     Last menstrual period 10/06/2013.There is no height or weight on file to calculate BMI.  General Appearance: Casual  Eye Contact:  Good  Speech:  Clear and Coherent  Volume:  Normal   Mood: fair   Affect:  Congruent  Thought Process:  Goal Directed  Orientation:  Full (Time, Place, and Person)  Thought Content:  Rumination  Suicidal Thoughts:  No  Homicidal Thoughts:  No  Memory:  Immediate;   Fair Recent;   Fair  Judgement:  Fair  Insight:  Fair  Psychomotor Activity:  Normal  Concentration:  Concentration: Fair and Attention Span: Fair  Recall:  Good  Fund of Knowledge:Good  Language: Good  Akathisia:  No  Handed:    AIMS (if indicated):  not  done  Assets:  Communication Skills Desire for Improvement Financial Resources/Insurance Housing Physical Health  ADL's:  Intact  Cognition: WNL  Sleep:   variable   Screenings: Camera operator Row Office Visit from 07/30/2021 in [facility] at [facility] from 07/08/2018 in [facility] at [facility]  PHQ-2 Total Score 0 6  PHQ-9 Total Score 9 14      Flowsheet Row Video Visit from 09/01/2021 in [facility] Video Visit from 04/02/2021 in [facility] Video Visit from 09/30/2020 in [facility] No Risk No Risk No Risk       Assessment and Plan: as follows  Prior documentation reviewed  MDD recurrent moderate: doing fair has stressors, continue wellbutrin   GAD ;fair continue low dose effexor Insomnia: tries to keep 7 hours of sleep, it helps to deal with next day Fu  53m  NMerian Capron MD 8/21/20232:12 PM

## 2021-09-16 NOTE — Addendum Note (Signed)
Addended by: Kristian Covey on: 09/16/2021 06:00 PM   Modules accepted: Orders

## 2021-09-16 NOTE — Telephone Encounter (Signed)
I went ahead and ordered ultrasound to further assess.  This should rule out any gallbladder disease hopefully  Nancy Covey MD [facility]

## 2021-09-30 ENCOUNTER — Other Ambulatory Visit: Payer: 59

## 2021-10-03 ENCOUNTER — Ambulatory Visit
Admission: RE | Admit: 2021-10-03 | Discharge: 2021-10-03 | Disposition: A | Discharge status: Home / Self Care | Payer: 59 | Source: Ambulatory Visit | Attending: Family Medicine | Admitting: Family Medicine

## 2021-10-03 DIAGNOSIS — R1010 Upper abdominal pain, unspecified: Secondary | ICD-10-CM

## 2021-10-14 ENCOUNTER — Ambulatory Visit
Admission: RE | Admit: 2021-10-14 | Discharge: 2021-10-14 | Disposition: A | Discharge status: Home / Self Care | Payer: 59 | Source: Ambulatory Visit | Attending: Family Medicine | Admitting: Family Medicine

## 2021-10-15 ENCOUNTER — Telehealth: Payer: Self-pay | Admitting: Family Medicine

## 2021-10-15 ENCOUNTER — Other Ambulatory Visit: Payer: Self-pay | Admitting: Family Medicine

## 2021-10-15 DIAGNOSIS — K769 Liver disease, unspecified: Secondary | ICD-10-CM

## 2021-10-15 NOTE — Progress Notes (Signed)
Ultrasound showed 1.7 cm right renal cyst of uncertain significance.  Also 1.7 cm right lobe liver mass which may represent hemangioma but etiology unclear.  Recommendation for MRI abdomen with and without contrast and this was ordered  Eulas Post MD  Primary Care at [facility]

## 2021-10-15 NOTE — Telephone Encounter (Signed)
Pt is returning call concerning abd ultrasound result

## 2021-10-15 NOTE — Telephone Encounter (Signed)
See result note.  

## 2021-10-27 ENCOUNTER — Encounter: Payer: Self-pay | Admitting: Family Medicine

## 2021-10-31 ENCOUNTER — Telehealth: Payer: Self-pay | Admitting: Family Medicine

## 2021-10-31 ENCOUNTER — Other Ambulatory Visit: Payer: Self-pay

## 2021-10-31 DIAGNOSIS — K219 Gastro-esophageal reflux disease without esophagitis: Secondary | ICD-10-CM

## 2021-10-31 NOTE — Telephone Encounter (Signed)
Requesting clarification on if you were aware she is currently being treated at [facility], last seen 10/27/21. Patient has had an endoscopy last month.

## 2021-10-31 NOTE — Telephone Encounter (Signed)
Noted  Nancy Choi Amariz Flamenco MD [facility]  

## 2021-11-11 ENCOUNTER — Ambulatory Visit
Admission: RE | Admit: 2021-11-11 | Discharge: 2021-11-11 | Disposition: A | Discharge status: Home / Self Care | Payer: 59 | Source: Ambulatory Visit | Attending: Family Medicine | Admitting: Family Medicine

## 2021-11-11 DIAGNOSIS — K769 Liver disease, unspecified: Secondary | ICD-10-CM

## 2021-11-11 MED ORDER — GADOPICLENOL 0.5 MMOL/ML IV SOLN
6.0000 mL | Freq: Once | INTRAVENOUS | Status: AC | PRN
  Administered 2021-11-11: 6 mL via INTRAVENOUS

## 2021-11-13 ENCOUNTER — Other Ambulatory Visit (HOSPITAL_COMMUNITY): Payer: Self-pay | Admitting: Psychiatry

## 2021-12-25 ENCOUNTER — Telehealth (INDEPENDENT_AMBULATORY_CARE_PROVIDER_SITE_OTHER): Payer: 59 | Admitting: Family Medicine

## 2021-12-25 ENCOUNTER — Encounter: Payer: Self-pay | Admitting: Family Medicine

## 2021-12-25 VITALS — Temp 98.5°F | Ht 68.0 in | Wt 124.9 lb

## 2021-12-25 DIAGNOSIS — R0981 Nasal congestion: Secondary | ICD-10-CM

## 2021-12-25 DIAGNOSIS — R051 Acute cough: Secondary | ICD-10-CM | POA: Diagnosis not present

## 2021-12-25 MED ORDER — BENZONATATE 100 MG PO CAPS: ORAL_CAPSULE | ORAL | 0 refills | Status: DC

## 2021-12-25 MED ORDER — AMOXICILLIN-POT CLAVULANATE 875-125 MG PO TABS: 1.0000 | ORAL_TABLET | Freq: Two times a day (BID) | ORAL | 0 refills | Status: DC

## 2021-12-25 NOTE — Patient Instructions (Signed)
  HOME CARE TIPS:   -I sent the medication(s) we discussed to your pharmacy: Meds ordered this encounter  Medications   amoxicillin-clavulanate (AUGMENTIN) 875-125 MG tablet    Sig: Take 1 tablet by mouth 2 (two) times daily.    Dispense:  14 tablet    Refill:  0   benzonatate (TESSALON PERLES) 100 MG capsule    Sig: 1-2 capsules up to twice daily as needed for cough.    Dispense:  30 capsule    Refill:  0    -nasal saline twice daily for 5 days  -can use tylenol if needed for fevers, aches and pains per instructions  -nasal saline sinus rinses twice daily  -stay hydrated, drink plenty of fluids and eat small healthy meals - avoid dairy  -follow up with your doctor in 2-3 days unless improving and feeling better  -stay home while sick, except to seek medical care. If you have COVID19, you will likely be contagious for 7-10 days. Flu or Influenza is likely contagious for about 7 days. Other respiratory viral infections remain contagious for 5-10+ days depending on the virus and many other factors. Wear a good mask that fits snugly (such as N95 or KN95) if around others to reduce the risk of transmission.  It was nice to meet you today, and I really hope you are feeling better soon. I help [facility] out with telemedicine visits on Tuesdays and Thursdays and am happy to help if you need a follow up virtual visit on those days. Otherwise, if you have any concerns or questions following this visit please schedule a follow up visit with your Primary Care doctor or seek care at a local urgent care clinic to avoid delays in care.    Seek in person care or schedule a follow up video visit promptly if your symptoms worsen, new concerns arise or you are not improving with treatment. Call 911 and/or seek emergency care if your symptoms are severe or life threatening.

## 2021-12-25 NOTE — Progress Notes (Signed)
Virtual Visit via Video Note  I connected with Nancy Choi  on 12/25/21 at  1:20 PM EST by a video enabled telemedicine application and verified that I am speaking with the correct person using two identifiers.  Location patient: [place] Location provider:work or home office Persons participating in the virtual visit: patient, provider  I discussed the limitations and requested verbal permission for telemedicine visit. The patient expressed understanding and agreed to proceed.   HPI:  Acute telemedicine visit for flu like symptoms: -Onset: about 5 days, but feels is worsening -Symptoms include: body aches, nasal congestion, chills, low grade fevers, tooth pain now with lots of thick sinus congestion that is yellow -Denies: CP, SOB, NVD -drinking lots of fluids -Has tried: robitussin, soup, sudafed -Pertinent past medical history: see below -Pertinent medication allergies: Allergies  Allergen Reactions   Flagyl [Metronidazole] Nausea Only   Codeine Sulfate     REACTION: irritable, bugs crawling on skin sensation  -COVID-19 vaccine status:  Immunization History  Administered Date(s) Administered   Influenza,inj,Quad PF,6+ Mos 11/19/2014   Janssen (J&J) SARS-COV-2 Vaccination 04/11/2019   Tdap 07/08/2018     ROS: See pertinent positives and negatives per HPI.  Past Medical History:  Diagnosis Date   Allergy    DEPRESSION 08/14/2008   Qualifier: Diagnosis of  By: Gabriel Rung LPN, Nancy     Generalized anxiety disorder 08/14/2008   Qualifier: Diagnosis of  By: Gabriel Rung LPN, Harriett Sine     HYPOTHYROIDISM 08/14/2008   Qualifier: Diagnosis of  By: Gabriel Rung LPN, Harriett Sine      Past Surgical History:  Procedure Laterality Date   BREAST LUMPECTOMY Right    1985 benign   BREAST SURGERY       Current Outpatient Medications:    amoxicillin-clavulanate (AUGMENTIN) 875-125 MG tablet, Take 1 tablet by mouth 2 (two) times daily., Disp: 14 tablet, Rfl: 0   ARMOUR THYROID 60 MG tablet, Takes 60mg  in the  morning and 60 mg at night., Disp: , Rfl:    Ascorbic Acid (VITAMIN C) 1000 MG tablet, Take 1,000 mg by mouth daily., Disp: , Rfl:    benzocaine-resorcinol (VAGISIL) 5-2 % vaginal cream, Place vaginally at bedtime., Disp: 60 g, Rfl: 0   benzonatate (TESSALON PERLES) 100 MG capsule, 1-2 capsules up to twice daily as needed for cough., Disp: 30 capsule, Rfl: 0   buPROPion (WELLBUTRIN XL) 150 MG 24 hr tablet, Take 1 tablet (150 mg total) by mouth daily., Disp: 90 tablet, Rfl: 0   Cholecalciferol (VITAMIN D) 2000 UNITS CAPS, Take by mouth 3 (three) times a week. , Disp: , Rfl:    estradiol (ESTRACE) 1 MG tablet, TAKE ONE AND A HALF TABLET BY MOUTH EVERY DAY, Disp: , Rfl:    INTRAROSA 6.5 MG INST, INSERT 1 APPLICATORFUL INTRAVAGINALLY AT BEDTIME NIGHTLY., Disp: , Rfl:    progesterone (PROMETRIUM) 100 MG capsule, Take 150 mg by mouth daily. , Disp: , Rfl:    venlafaxine (EFFEXOR) 25 MG tablet, Take 0.5 tablets (12.5 mg total) by mouth daily. Compound to total dose of 12.5mg  from capsule or tablet as per past history of taking it, Disp: 45 tablet, Rfl: 0   vitamin E 100 UNIT capsule, Take 100 Units by mouth daily., Disp: , Rfl:   EXAM:  VITALS per patient if applicable:  GENERAL: alert, oriented, appears well and in no acute distress  HEENT: atraumatic, conjunttiva clear, no obvious abnormalities on inspection of external nose and ears  NECK: normal movements of the head and neck  LUNGS:  on inspection no signs of respiratory distress, breathing rate appears normal, no obvious gross SOB, gasping or wheezing  CV: no obvious cyanosis  MS: moves all visible extremities without noticeable abnormality  PSYCH/NEURO: pleasant and cooperative, no obvious depression or anxiety, speech and thought processing grossly intact  ASSESSMENT AND PLAN:  Discussed the following assessment and plan:  Nasal congestion  Acute cough  -we discussed possible serious and likely etiologies, options for evaluation  and workup, limitations of telemedicine visit vs in person visit, treatment, treatment risks and precautions. Pt is agreeable to treatment via telemedicine at this moment. Query Covid, flu, viral infection vs developing bacterial sinusitis vs other. She has opted to do home covid test, nasal saline sinus rinse, tessalon rx for cough and Augmentin if worsening or not improving with other measures.   Advised to seek prompt virtual visit or in person care if worsening, new symptoms arise, or if is not improving with treatment as expected per our conversation of expected course. Discussed options for follow up care. Did let this patient know that I do telemedicine on Tuesdays and Thursdays for  and those are the days I am logged into the system. Advised to schedule follow up visit with PCP, [place] virtual visits or [facility] if any further questions or concerns to avoid delays in care.   I discussed the assessment and treatment plan with the patient. The patient was provided an opportunity to ask questions and all were answered. The patient agreed with the plan and demonstrated an understanding of the instructions.     Terressa Koyanagi, DO

## 2021-12-30 ENCOUNTER — Encounter: Payer: Self-pay | Admitting: Family Medicine

## 2022-01-13 ENCOUNTER — Encounter: Payer: Self-pay | Admitting: Family Medicine

## 2022-01-13 ENCOUNTER — Ambulatory Visit (INDEPENDENT_AMBULATORY_CARE_PROVIDER_SITE_OTHER): Payer: 59 | Admitting: Family Medicine

## 2022-01-13 VITALS — BP 100/60 | HR 84 | Temp 97.8°F | Ht 68.0 in | Wt 125.8 lb

## 2022-01-13 DIAGNOSIS — M47812 Spondylosis without myelopathy or radiculopathy, cervical region: Secondary | ICD-10-CM | POA: Diagnosis not present

## 2022-01-13 DIAGNOSIS — K219 Gastro-esophageal reflux disease without esophagitis: Secondary | ICD-10-CM | POA: Diagnosis not present

## 2022-01-13 NOTE — Patient Instructions (Signed)
Suspect degenerative spondylosis (arthritis)  Watch for any progressive pain or upper extremity weakness or numbness.

## 2022-01-13 NOTE — Progress Notes (Signed)
   Established Patient Office Visit  Subjective   Patient ID: Nancy Choi, female    DOB: 01-23-1965  Age: 57 y.o. MRN: [id number]  Chief Complaint  Patient presents with   Neck Pain    Patient complains of neck pain, x2 months, Patient denies any known injury,    HPI   Patient is seen with some soreness left side of neck but more than anything she has noted some abnormal sounds when moving her neck.  She describes a "crunching sound" when lateral bending or rotating her neck predominantly left side.  Denies any cervical radiculitis symptoms.  No upper extremity numbness or weakness.  Denies any headaches.  No recent injury.  Past Medical History:  Diagnosis Date   Allergy    DEPRESSION 08/14/2008   Qualifier: Diagnosis of  By: Valma Cava LPN, Nancy     Generalized anxiety disorder 08/14/2008   Qualifier: Diagnosis of  By: Valma Cava LPN, Izora Gala     HYPOTHYROIDISM 08/14/2008   Qualifier: Diagnosis of  By: Valma Cava LPN, Izora Gala     Past Surgical History:  Procedure Laterality Date   BREAST LUMPECTOMY Right    1985 benign   BREAST SURGERY      reports that she has never smoked. She has never used smokeless tobacco. She reports that she does not drink alcohol and does not use drugs. family history includes Breast cancer (age of onset: 43) in her cousin; Breast cancer (age of onset: 6) in her mother; Depression in her mother; Diabetes in her paternal grandmother; Hyperlipidemia in her father; Hypertension in her father. Allergies  Allergen Reactions   Flagyl [Metronidazole] Nausea Only   Codeine Sulfate     REACTION: irritable, bugs crawling on skin sensation    Review of Systems  Cardiovascular:  Negative for chest pain.  Neurological:  Negative for dizziness, tingling, weakness and headaches.      Objective:     BP 100/60 (BP Location: Left Arm, Patient Position: Sitting, Cuff Size: Normal)   Pulse 84   Temp 97.8 F (36.6 C) (Oral)   Ht 5\' 8"  (1.727 m)   Wt 125 lb 12.8 oz  (57.1 kg)   LMP 10/06/2013   SpO2 98%   BMI 19.13 kg/m    Physical Exam Vitals reviewed.  Constitutional:      Appearance: Normal appearance.  Neck:     Comments: Full range of motion cervical spine No cervical bruits Cardiovascular:     Rate and Rhythm: Normal rate and regular rhythm.  Pulmonary:     Effort: Pulmonary effort is normal.     Breath sounds: Normal breath sounds.  Musculoskeletal:     Cervical back: Neck supple.  Lymphadenopathy:     Cervical: No cervical adenopathy.  Neurological:     Mental Status: She is alert.     Comments: Full strength upper extremities.  Symmetric upper extremity reflexes.      No results found for any visits on 01/13/22.    The 10-year ASCVD risk score (Arnett DK, et al., 2019) is: 0.9%    Assessment & Plan:   Cervical neck symptoms.  She describes abnormal sound when moving her neck but no associated severe pain.  Nonfocal neuroexam.  Suspect related to cervical spondylosis.  -Reassurance at this time since she has no evidence for upper extremity weakness or numbness and no severe pain -Consider plain film cervical spine for any progressive pain or other concerns.  Carolann Littler, MD

## 2022-01-26 NOTE — Progress Notes (Unsigned)
New Patient Note  RE: Nancy Choi MRN: [id number] DOB: [birth year] Date of Office Visit: 01/27/2022  Consult requested by: Eulas Post, MD Primary care provider: Eulas Post, MD  Chief Complaint: Other (Has been having Gi issues since July. Has seen Gi and diagnosed w GERD. Having other sx and wondering if they are allergies. Gluten free-oatmeal, wheat,whole-grain bread)  History of Present Illness: I had the pleasure of seeing Nancy Choi for initial evaluation at the [facility] on 01/27/2022. She is a 57 y.o. female, who is self-referred here for the evaluation of allergies.  Patient has been having GI issues since July 2023. She has been seeing GI and started on Aciphex BID which helped. Other PPI ineffective. She had EGD and Barium swallow done.  She is still having some voice hoarseness, chest tightness, some lightheadedness, blurry vision (on rare occasions).  Patient has not seen neurology or eye doctor for this.   Oatmeal, whole grain bread seems to be a trigger but still has some symptoms even without eating these foods. She would like to know what it is that are triggering her symptoms.   The symptoms can persist for several days after oat exposure.   No issues with white bread.   Past work up includes: none. Dietary History: patient has been eating other foods including milk, eggs, peanut, treenuts, sesame, shellfish, fish, soy, limited wheat, fruits and vegetables. Avoiding meat - due to personal preference.  She reports reading labels and avoiding oats and whole grain bread in diet completely.  01/14/2022 GI visit: "Discussion and Plan  Patient presenting for follow-up regarding GERD. She has typical symptoms of some substernal burning and regurgitation but also some atypical symptoms of hoarseness. She has been doing well now on Aciphex 20 mg twice daily. I recommend she continue that and at some point can consider tapering down  to once a day. We discussed various lifestyle measures including avoiding triggering food items which for her appears to be chocolate and also caffeine from the tea that she drinks. No other testing is needed at this point. As we have discussed before I advised that if PPIs are not controlling her reflux and the next up is antireflux surgery which she would like to avoid at this time.  Follow up in about 3 months (around 04/15/2022) for GERD. Patient Instructions  Thank you for visiting [place] in clinic today. I recommend the following: - continue with current GI medications. Try coming down on Aciphex to once a day - avoid chocolate and limit caffeine intake - follow up in 3 months "  Assessment and Plan: [name] is a 57 y.o. female with: Dietary counseling and surveillance GI symptoms since July 2023 which improved with Aciphex BID. EGD and barium swallow unremarkable. Noted that symptoms flare after eating oats and whole grain bread and symptoms can persists for days - voice hoarseness, chest tightness, lightheadedness and blurry vision on rare occasions. Sometimes has milder symptoms even without eating these foods.  Discussed with patient that skin prick testing and bloodwork (food IgE levels) check for IgE mediated reactions which her clinical presentation does not support.  Keep a food journal with symptoms and foods eaten. No indication for any skin testing to foods today. Avoid oats as it seems to be a trigger for you. You may try to find a whole grain bread without oats and see if it still causes symptoms.   If you keep having the lightheadedness and  blurry vision issues then recommend eye evaluation and neurology work up next. Voice hoarseness can be caused by various things including GERD, post nasal drip.  If no improvement, recommend ENT evaluation to look at vocal cords. Chest tightness Monitor symptoms. Follow up with PCP if symptoms worsen.  GERD (gastroesophageal reflux  disease) See handout for lifestyle and dietary modifications. Continue Aciphex as per GI.  Return if symptoms worsen or fail to improve.  No orders of the defined types were placed in this encounter.  Lab Orders  No laboratory test(s) ordered today    Other allergy screening: Asthma: no Used inhaler during pneumonia. Rhino conjunctivitis:  slight symptoms in the spring and fall. Takes OTC antihistamines prn with good benefit.  Medication allergy: no Hymenoptera allergy: no Urticaria: no Eczema: on arms and using topical creams. History of recurrent infections suggestive of immunodeficency: no  Diagnostics: None.  Past Medical History: Patient Active Problem List   Diagnosis Date Noted   Dietary counseling and surveillance 01/27/2022   Gastrointestinal complaints 01/27/2022   GERD (gastroesophageal reflux disease) 01/13/2022   Facial pain 06/16/2013   HYPOTHYROIDISM 08/14/2008   GENERALIZED ANXIETY DISORDER 08/14/2008   Depression 08/14/2008   Past Medical History:  Diagnosis Date   Allergy    DEPRESSION 08/14/2008   Qualifier: Diagnosis of  By: Valma Cava LPN, Nancy     Eczema    Generalized anxiety disorder 08/14/2008   Qualifier: Diagnosis of  By: Valma Cava LPN, Izora Gala     HYPOTHYROIDISM 08/14/2008   Qualifier: Diagnosis of  By: Valma Cava LPN, Izora Gala     Past Surgical History: Past Surgical History:  Procedure Laterality Date   BREAST LUMPECTOMY Right    1985 benign   BREAST SURGERY     Medication List:  Current Outpatient Medications  Medication Sig Dispense Refill   ARMOUR THYROID 60 MG tablet Takes 60mg  in the morning and 60 mg at night.     Ascorbic Acid (VITAMIN C) 1000 MG tablet Take 1,000 mg by mouth daily.     Benzocaine-Resorcinol (VAGISIL) 5-2 % CREA Vagisil 5 %-2 % topical cream  PLACE VAGINALLY AT BEDTIME.     buPROPion (WELLBUTRIN XL) 150 MG 24 hr tablet Take 1 tablet (150 mg total) by mouth daily. 90 tablet 0   Cholecalciferol (VITAMIN D) 2000  UNITS CAPS Take by mouth 3 (three) times a week.      ciclopirox (PENLAC) 8 % solution Apply topically.     cyanocobalamin 100 MCG tablet Take by mouth.     estradiol (ESTRACE) 1 MG tablet TAKE ONE AND A HALF TABLET BY MOUTH EVERY DAY     INTRAROSA 6.5 MG INST INSERT 1 APPLICATORFUL INTRAVAGINALLY AT BEDTIME NIGHTLY.     progesterone (PROMETRIUM) 100 MG capsule Take 150 mg by mouth daily.      RABEprazole (ACIPHEX) 20 MG tablet Take 20 mg by mouth 2 (two) times daily.     sucralfate (CARAFATE) 1 g tablet Take 1 tablet by mouth 4 (four) times daily.     triamcinolone cream (KENALOG) 0.1 % Apply to affected area twice a day     venlafaxine (EFFEXOR) 25 MG tablet Take 0.5 tablets (12.5 mg total) by mouth daily. Compound to total dose of 12.5mg  from capsule or tablet as per past history of taking it 45 tablet 0   vitamin E 100 UNIT capsule Take 100 Units by mouth daily.     No current facility-administered medications for this visit.   Allergies: Allergies  Allergen Reactions  Flagyl [Metronidazole] Nausea Only   Codeine Sulfate     REACTION: irritable, bugs crawling on skin sensation   Other     Covid Vaccine    Doxycycline Nausea Only   Social History: Social History   Socioeconomic History   Marital status: Married    Spouse name: Not on file   Number of children: Not on file   Years of education: Not on file   Highest education level: Not on file  Occupational History   Not on file  Tobacco Use   Smoking status: Never   Smokeless tobacco: Never  Vaping Use   Vaping Use: Never used  Substance and Sexual Activity   Alcohol use: No    Alcohol/week: 0.0 standard drinks of alcohol   Drug use: No   Sexual activity: Not on file  Other Topics Concern   Not on file  Social History Narrative   Not on file   Social Determinants of Health   Financial Resource Strain: Not on file  Food Insecurity: Not on file  Transportation Needs: Not on file  Physical Activity: Not on  file  Stress: Not on file  Social Connections: Not on file   Lives in a house which is 58 years old. Smoking: denies Occupation: Therapist, nutritional HistoryFreight forwarder in the house: no Charity fundraiser in the family room: no Carpet in the bedroom: no Heating:  gas and electric Cooling: central Pet: yes 1 cat x 7 yrs, 1 dog x 1 yr  Family History: Family History  Problem Relation Age of Onset   Breast cancer Mother 20       metastic (-) BrCA gene. blood work shows ATM mutation in [place] (2022)   Depression Mother    Hyperlipidemia Father    Hypertension Father    Asthma Maternal Grandmother    Diabetes Paternal Grandmother    Breast cancer Cousin 64   Stomach cancer Neg Hx    Rectal cancer Neg Hx    Esophageal cancer Neg Hx    Colon polyps Neg Hx    Colon cancer Neg Hx    Review of Systems  Constitutional:  Negative for appetite change, chills, fever and unexpected weight change.  HENT:  Positive for voice change. Negative for congestion and rhinorrhea.   Eyes:  Negative for itching.  Respiratory:  Negative for cough, chest tightness, shortness of breath and wheezing.   Cardiovascular:  Negative for chest pain.  Gastrointestinal:  Positive for abdominal pain and constipation. Negative for diarrhea, nausea and vomiting.  Genitourinary:  Negative for difficulty urinating.  Skin:  Negative for rash.  Neurological:  Positive for dizziness and light-headedness. Negative for headaches.    Objective: BP 104/68   Pulse 81   Temp 98 F (36.7 C)   Resp 18   Ht 5' 9.29" (1.76 m)   Wt 125 lb 8 oz (56.9 kg)   LMP 10/06/2013   SpO2 97%   BMI 18.38 kg/m  Body mass index is 18.38 kg/m. Physical Exam Vitals and nursing note reviewed.  Constitutional:      Appearance: Normal appearance. She is well-developed.  HENT:     Head: Normocephalic and atraumatic.     Right Ear: Tympanic membrane and external ear normal.     Left Ear: Tympanic membrane and external ear  normal.     Nose: Nose normal.     Mouth/Throat:     Mouth: Mucous membranes are moist.     Pharynx: Oropharynx is clear.  Eyes:     Conjunctiva/sclera: Conjunctivae normal.  Cardiovascular:     Rate and Rhythm: Normal rate and regular rhythm.     Heart sounds: Normal heart sounds. No murmur heard.    No friction rub. No gallop.  Pulmonary:     Effort: Pulmonary effort is normal.     Breath sounds: Normal breath sounds. No wheezing, rhonchi or rales.  Musculoskeletal:     Cervical back: Neck supple.  Skin:    General: Skin is warm.     Findings: No rash.  Neurological:     Mental Status: She is alert and oriented to person, place, and time.  Psychiatric:        Behavior: Behavior normal.   The plan was reviewed with the patient/family, and all questions/concerned were addressed.  It was my pleasure to see Quintara today and participate in her care. Please feel free to contact me with any questions or concerns.  Sincerely,  Wyline Mood, DO Allergy & Immunology  [facility] office: [phone] [facility] office: [phone]

## 2022-01-27 ENCOUNTER — Ambulatory Visit: Payer: 59 | Admitting: Allergy

## 2022-01-27 ENCOUNTER — Encounter: Payer: Self-pay | Admitting: Allergy

## 2022-01-27 VITALS — BP 104/68 | HR 81 | Temp 98.0°F | Resp 18 | Ht 69.29 in | Wt 125.5 lb

## 2022-01-27 DIAGNOSIS — Z713 Dietary counseling and surveillance: Secondary | ICD-10-CM

## 2022-01-27 DIAGNOSIS — K219 Gastro-esophageal reflux disease without esophagitis: Secondary | ICD-10-CM | POA: Diagnosis not present

## 2022-01-27 DIAGNOSIS — R198 Other specified symptoms and signs involving the digestive system and abdomen: Secondary | ICD-10-CM | POA: Diagnosis not present

## 2022-01-27 NOTE — Assessment & Plan Note (Addendum)
GI symptoms since July 2023 which improved with Aciphex BID. EGD and barium swallow unremarkable. Noted that symptoms flare after eating oats and whole grain bread and symptoms can persists for days - voice hoarseness, chest tightness, lightheadedness and blurry vision on rare occasions. Sometimes has milder symptoms even without eating these foods.  Discussed with patient that skin prick testing and bloodwork (food IgE levels) check for IgE mediated reactions which her clinical presentation does not support.  Keep a food journal with symptoms and foods eaten. No indication for any skin testing to foods today. Avoid oats as it seems to be a trigger for you. You may try to find a whole grain bread without oats and see if it still causes symptoms.   If you keep having the lightheadedness and blurry vision issues then recommend eye evaluation and neurology work up next. Voice hoarseness can be caused by various things including GERD, post nasal drip.  If no improvement, recommend ENT evaluation to look at vocal cords. Chest tightness Monitor symptoms. Follow up with PCP if symptoms worsen.

## 2022-01-27 NOTE — Patient Instructions (Addendum)
Discussed with patient that skin prick testing and bloodwork (food IgE levels) check for IgE mediated reactions which her clinical presentation does not support.  Keep a food journal with symptoms and foods eaten.  Avoid oats as it seems to be a trigger for you. You may try to find a whole grain bread without oats and see if it still causes symptoms.   If you keep having the lightheadedness and blurry vision issues then recommend eye evaluation and neurology work up next. Voice hoarseness can be caused by various things including GERD, post nasal drip.  If no improvement, may want to see ENT to look at vocal cords. Chest tightness Monitor symptoms.  Follow up with PCP if symptoms worsen.   Follow up as needed.

## 2022-01-27 NOTE — Assessment & Plan Note (Signed)
See handout for lifestyle and dietary modifications. Continue Aciphex as per GI.

## 2022-02-04 ENCOUNTER — Telehealth: Payer: Self-pay | Admitting: Allergy

## 2022-02-04 NOTE — Telephone Encounter (Signed)
Yes. Noted

## 2022-02-04 NOTE — Telephone Encounter (Signed)
Patient called and cancelled her appointment for skin test. She said that she was told she did not need to come back.

## 2022-02-04 NOTE — Progress Notes (Deleted)
Follow Up Note  RE: Nancy Choi MRN: [id number] DOB: 04-20-1965 Date of Office Visit: 02/05/2022  Referring provider: Eulas Post, MD Primary care provider: Eulas Post, MD  Chief Complaint: No chief complaint on file.  History of Present Illness: I had the pleasure of seeing Nancy Choi for a follow up visit at the [facility] on 02/04/2022. She is a 57 y.o. female, who is being followed for ***. Her previous allergy office visit was on 01/27/2022 with Dr. Maudie Mercury. Today is a skin testing and follow up visit.  Dietary counseling and surveillance GI symptoms since July 2023 which improved with Aciphex BID. EGD and barium swallow unremarkable. Noted that symptoms flare after eating oats and whole grain bread and symptoms can persists for days - voice hoarseness, chest tightness, lightheadedness and blurry vision on rare occasions. Sometimes has milder symptoms even without eating these foods.  Discussed with patient that skin prick testing and bloodwork (food IgE levels) check for IgE mediated reactions which her clinical presentation does not support.  Keep a food journal with symptoms and foods eaten. No indication for any skin testing to foods today. Avoid oats as it seems to be a trigger for you. You may try to find a whole grain bread without oats and see if it still causes symptoms.    If you keep having the lightheadedness and blurry vision issues then recommend eye evaluation and neurology work up next. Voice hoarseness can be caused by various things including GERD, post nasal drip.  If no improvement, recommend ENT evaluation to look at vocal cords. Chest tightness Monitor symptoms. Follow up with PCP if symptoms worsen.   GERD (gastroesophageal reflux disease) See handout for lifestyle and dietary modifications. Continue Aciphex as per GI.  Assessment and Plan: [name] is a 57 y.o. female with: No problem-specific Assessment & Plan notes  found for this encounter.  No follow-ups on file.  No orders of the defined types were placed in this encounter.  Lab Orders  No laboratory test(s) ordered today    Diagnostics: Spirometry:  Tracings reviewed. Her effort: {Blank single:19197::"Good reproducible efforts.","It was hard to get consistent efforts and there is a question as to whether this reflects a maximal maneuver.","Poor effort, data can not be interpreted."} FVC: ***L FEV1: ***L, ***% predicted FEV1/FVC ratio: ***% Interpretation: {Blank single:19197::"Spirometry consistent with mild obstructive disease","Spirometry consistent with moderate obstructive disease","Spirometry consistent with severe obstructive disease","Spirometry consistent with possible restrictive disease","Spirometry consistent with mixed obstructive and restrictive disease","Spirometry uninterpretable due to technique","Spirometry consistent with normal pattern","No overt abnormalities noted given today's efforts"}.  Please see scanned spirometry results for details.  Skin Testing: {Blank single:19197::"Select foods","Environmental allergy panel","Environmental allergy panel and select foods","Food allergy panel","None","Deferred due to recent antihistamines use"}. *** Results discussed with patient/family.   Medication List:  Current Outpatient Medications  Medication Sig Dispense Refill  . ARMOUR THYROID 60 MG tablet Takes '60mg'$  in the morning and 60 mg at night.    . Ascorbic Acid (VITAMIN C) 1000 MG tablet Take 1,000 mg by mouth daily.    . Benzocaine-Resorcinol (VAGISIL) 5-2 % CREA Vagisil 5 %-2 % topical cream  PLACE VAGINALLY AT BEDTIME.    Marland Kitchen buPROPion (WELLBUTRIN XL) 150 MG 24 hr tablet Take 1 tablet (150 mg total) by mouth daily. 90 tablet 0  . Cholecalciferol (VITAMIN D) 2000 UNITS CAPS Take by mouth 3 (three) times a week.     . ciclopirox (PENLAC) 8 % solution Apply topically.    Marland Kitchen  cyanocobalamin 100 MCG tablet Take by mouth.    .  estradiol (ESTRACE) 1 MG tablet TAKE ONE AND A HALF TABLET BY MOUTH EVERY DAY    . INTRAROSA 6.5 MG INST INSERT 1 APPLICATORFUL INTRAVAGINALLY AT BEDTIME NIGHTLY.    . progesterone (PROMETRIUM) 100 MG capsule Take 150 mg by mouth daily.     . RABEprazole (ACIPHEX) 20 MG tablet Take 20 mg by mouth 2 (two) times daily.    . sucralfate (CARAFATE) 1 g tablet Take 1 tablet by mouth 4 (four) times daily.    Marland Kitchen triamcinolone cream (KENALOG) 0.1 % Apply to affected area twice a day    . venlafaxine (EFFEXOR) 25 MG tablet Take 0.5 tablets (12.5 mg total) by mouth daily. Compound to total dose of 12.'5mg'$  from capsule or tablet as per past history of taking it 45 tablet 0  . vitamin E 100 UNIT capsule Take 100 Units by mouth daily.     No current facility-administered medications for this visit.   Allergies: Allergies  Allergen Reactions  . Flagyl [Metronidazole] Nausea Only  . Codeine Sulfate     REACTION: irritable, bugs crawling on skin sensation  . Other     Covid Vaccine   . Doxycycline Nausea Only   I reviewed her past medical history, social history, family history, and environmental history and no significant changes have been reported from her previous visit.  Review of Systems  Constitutional:  Negative for appetite change, chills, fever and unexpected weight change.  HENT:  Positive for voice change. Negative for congestion and rhinorrhea.   Eyes:  Negative for itching.  Respiratory:  Negative for cough, chest tightness, shortness of breath and wheezing.   Cardiovascular:  Negative for chest pain.  Gastrointestinal:  Positive for abdominal pain and constipation. Negative for diarrhea, nausea and vomiting.  Genitourinary:  Negative for difficulty urinating.  Skin:  Negative for rash.  Neurological:  Positive for dizziness and light-headedness. Negative for headaches.   Objective: LMP 10/06/2013  There is no height or weight on file to calculate BMI. Physical Exam Vitals and nursing  note reviewed.  Constitutional:      Appearance: Normal appearance. She is well-developed.  HENT:     Head: Normocephalic and atraumatic.     Right Ear: Tympanic membrane and external ear normal.     Left Ear: Tympanic membrane and external ear normal.     Nose: Nose normal.     Mouth/Throat:     Mouth: Mucous membranes are moist.     Pharynx: Oropharynx is clear.  Eyes:     Conjunctiva/sclera: Conjunctivae normal.  Cardiovascular:     Rate and Rhythm: Normal rate and regular rhythm.     Heart sounds: Normal heart sounds. No murmur heard.    No friction rub. No gallop.  Pulmonary:     Effort: Pulmonary effort is normal.     Breath sounds: Normal breath sounds. No wheezing, rhonchi or rales.  Musculoskeletal:     Cervical back: Neck supple.  Skin:    General: Skin is warm.     Findings: No rash.  Neurological:     Mental Status: She is alert and oriented to person, place, and time.  Psychiatric:        Behavior: Behavior normal.  Previous notes and tests were reviewed. The plan was reviewed with the patient/family, and all questions/concerned were addressed.  It was my pleasure to see Klair today and participate in her care. Please feel free to contact  me with any questions or concerns.  Sincerely,  Rexene Alberts, DO Allergy & Immunology  [facility] [place] office: [place] office: [phone]

## 2022-02-05 ENCOUNTER — Ambulatory Visit: Payer: Self-pay | Admitting: Allergy

## 2022-02-09 ENCOUNTER — Other Ambulatory Visit (HOSPITAL_COMMUNITY): Payer: Self-pay | Admitting: Psychiatry

## 2022-02-23 ENCOUNTER — Encounter (HOSPITAL_COMMUNITY): Payer: Self-pay | Admitting: Psychiatry

## 2022-02-23 ENCOUNTER — Telehealth (INDEPENDENT_AMBULATORY_CARE_PROVIDER_SITE_OTHER): Payer: 59 | Admitting: Psychiatry

## 2022-02-23 DIAGNOSIS — F411 Generalized anxiety disorder: Secondary | ICD-10-CM | POA: Diagnosis not present

## 2022-02-23 DIAGNOSIS — F5102 Adjustment insomnia: Secondary | ICD-10-CM

## 2022-02-23 DIAGNOSIS — F331 Major depressive disorder, recurrent, moderate: Secondary | ICD-10-CM | POA: Diagnosis not present

## 2022-02-23 MED ORDER — VENLAFAXINE HCL 25 MG PO TABS: 12.5000 mg | ORAL_TABLET | Freq: Every day | ORAL | 1 refills | Status: DC

## 2022-02-23 NOTE — Progress Notes (Signed)
Nancy Choi Follow up visit Patient Identification: Nancy Choi MRN:  [id number] Date of Evaluation:  02/23/2022 Referral Source: primary care Chief Complaint:  mood symptoms, insomnia follow up  Visit Diagnosis:    ICD-10-CM   1. MDD (major depressive disorder), recurrent episode, moderate (HCC)  F33.1     2. GAD (generalized anxiety disorder)  F41.1     3. Adjustment insomnia  F51.02      Virtual Visit via Video Note  I connected with Nancy Choi on 02/23/22 at  8:30 AM EST by a video enabled telemedicine application and verified that I am speaking with the correct person using two identifiers.  Location: Patient: home Provider: home office   I discussed the limitations of evaluation and management by telemedicine and the availability of in person appointments. The patient expressed understanding and agreed to proceed.      I discussed the assessment and treatment plan with the patient. The patient was provided an opportunity to ask questions and all were answered. The patient agreed with the plan and demonstrated an understanding of the instructions.   The patient was advised to call back or seek an in-person evaluation if the symptoms worsen or if the condition fails to improve as anticipated.  I provided 15 - 20  minutes of non-face-to-face time during this encounter.       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 No kids Brother lives next door.   Has been having GI symptoms now getting tratement for GERD Parents are sick mom has stage 4 breast cancer, patient helps them out , they live near by  Overall sleep , energy and depression manageable  She keeps busy at home as well  Aggravating factor:  Past relationship, husband death, job stress Modifying factor:  cats,  Duration :adult life Severity :manageable Past psych admission or suicide attempt: denies     Past Psychiatric History:  depression, anxiety  Previous Psychotropic Medications: Yes     Past Medical History:  Past Medical History:  Diagnosis Date   Allergy    DEPRESSION 08/14/2008   Qualifier: Diagnosis of  By: Valma Cava LPN, Nancy     Eczema    Generalized anxiety disorder 08/14/2008   Qualifier: Diagnosis of  By: Valma Cava LPN, Izora Gala     HYPOTHYROIDISM 08/14/2008   Qualifier: Diagnosis of  By: Valma Cava LPN, Izora Gala      Past Surgical History:  Procedure Laterality Date   BREAST LUMPECTOMY Right    1985 benign   BREAST SURGERY      Family Psychiatric History: mom: mood symptoms , depression  Family History:  Family History  Problem Relation Age of Onset   Breast cancer Mother 32       metastic (-) BrCA gene. blood work shows ATM mutation in [place] (2022)   Depression Mother    Hyperlipidemia Father    Hypertension Father    Asthma Maternal Grandmother    Diabetes Paternal Grandmother    Breast cancer Cousin 97   Stomach cancer Neg Hx    Rectal cancer Neg Hx    Esophageal cancer Neg Hx    Colon polyps Neg Hx    Colon cancer Neg Hx     Social History:   Social History   Socioeconomic History   Marital status: Single    Spouse name: Not on file   Number of children: Not on file   Years of education: Not on  file   Highest education level: Not on file  Occupational History   Not on file  Tobacco Use   Smoking status: Never   Smokeless tobacco: Never  Vaping Use   Vaping Use: Never used  Substance and Sexual Activity   Alcohol use: No    Alcohol/week: 0.0 standard drinks of alcohol   Drug use: No   Sexual activity: Not on file  Other Topics Concern   Not on file  Social History Narrative   Not on file   Social Determinants of Health   Financial Resource Strain: Not on file  Food Insecurity: Not on file  Transportation Needs: Not on file  Physical Activity: Not on file  Stress: Not on file  Social Connections: Not on file     Allergies:   Allergies  Allergen Reactions    Flagyl [Metronidazole] Nausea Only   Codeine Sulfate     REACTION: irritable, bugs crawling on skin sensation   Other     Covid Vaccine    Doxycycline Nausea Only    Metabolic Disorder Labs: No results found for: "HGBA1C", "MPG" No results found for: "PROLACTIN" Lab Results  Component Value Date   CHOL 193 04/30/2020   TRIG 38.0 04/30/2020   HDL 92.90 04/30/2020   CHOLHDL 2 04/30/2020   VLDL 7.6 04/30/2020   LDLCALC 93 04/30/2020   LDLCALC 97 07/08/2018   Lab Results  Component Value Date   TSH 0.02 (L) 04/30/2020    Therapeutic Level Labs: No results found for: "LITHIUM" No results found for: "CBMZ" No results found for: "VALPROATE"  Current Medications: Current Outpatient Medications  Medication Sig Dispense Refill   ARMOUR THYROID 60 MG tablet Takes 22m in the morning and 60 mg at night.     Ascorbic Acid (VITAMIN C) 1000 MG tablet Take 1,000 mg by mouth daily.     Benzocaine-Resorcinol (VAGISIL) 5-2 % CREA Vagisil 5 %-2 % topical cream  PLACE VAGINALLY AT BEDTIME.     buPROPion (WELLBUTRIN XL) 150 MG 24 hr tablet Take 1 tablet (150 mg total) by mouth daily. 90 tablet 0   Cholecalciferol (VITAMIN D) 2000 UNITS CAPS Take by mouth 3 (three) times a week.      ciclopirox (PENLAC) 8 % solution Apply topically.     cyanocobalamin 100 MCG tablet Take by mouth.     estradiol (ESTRACE) 1 MG tablet TAKE ONE AND A HALF TABLET BY MOUTH EVERY DAY     INTRAROSA 6.5 MG INST INSERT 1 APPLICATORFUL INTRAVAGINALLY AT BEDTIME NIGHTLY.     progesterone (PROMETRIUM) 100 MG capsule Take 150 mg by mouth daily.      RABEprazole (ACIPHEX) 20 MG tablet Take 20 mg by mouth 2 (two) times daily.     sucralfate (CARAFATE) 1 g tablet Take 1 tablet by mouth 4 (four) times daily.     triamcinolone cream (KENALOG) 0.1 % Apply to affected area twice a day     venlafaxine (EFFEXOR) 25 MG tablet Take 0.5 tablets (12.5 mg total) by mouth daily. Compound to total dose of 12.536mfrom capsule or  tablet as per past history of taking it 45 tablet 1   vitamin E 100 UNIT capsule Take 100 Units by mouth daily.     No current facility-administered medications for this visit.      Psychiatric Specialty Exam: Review of Systems  Cardiovascular:  Negative for chest pain.  Psychiatric/Behavioral:  Negative for agitation.     Last menstrual period 10/06/2013.There is no height  or weight on file to calculate BMI.  General Appearance: Casual  Eye Contact:  Good  Speech:  Clear and Coherent  Volume:  Normal  Mood: fair  Affect:  Congruent  Thought Process:  Goal Directed  Orientation:  Full (Time, Place, and Person)  Thought Content:  Rumination  Suicidal Thoughts:  No  Homicidal Thoughts:  No  Memory:  Immediate;   Fair Recent;   Fair  Judgement:  Fair  Insight:  Fair  Psychomotor Activity:  Normal  Concentration:  Concentration: Fair and Attention Span: Fair  Recall:  Good  Fund of Knowledge:Good  Language: Good  Akathisia:  No  Handed:    AIMS (if indicated):  not done  Assets:  Communication Skills Desire for Improvement Financial Resources/Insurance Housing Physical Health  ADL's:  Intact  Cognition: WNL  Sleep:   variable   Screenings: Camera operator Row Office Visit from 01/13/2022 in [place] at [place] from 07/30/2021 in [place] at [place] from 07/08/2018 in [place] at [place]  PHQ-2 Total Score 0 0 6  PHQ-9 Total Score 0 9 14      Flowsheet Row Video Visit from 09/01/2021 in [place] at [facility] Video Visit from 04/02/2021 in [place] at [facility] Video Visit from 09/30/2020 in [place] [facility] [place] No Risk No Risk No Risk       Assessment and Plan: as follows  Prior documentation reviewed  MDD  recurrent moderate: fair continue wellbutrin  GAD ;manageable with effexor, small dose, will continue  Insomnia: manageable, continue sleep hygiene  Fu  43m Refills if due were sent and meds discussed,  NMerian Capron MD 2/12/20248:40 AM

## 2022-03-09 ENCOUNTER — Encounter: Payer: Self-pay | Admitting: Family Medicine

## 2022-03-09 ENCOUNTER — Ambulatory Visit (INDEPENDENT_AMBULATORY_CARE_PROVIDER_SITE_OTHER): Payer: 59 | Admitting: Family Medicine

## 2022-03-09 VITALS — BP 94/60 | HR 67 | Temp 98.0°F | Ht 69.2 in | Wt 126.0 lb

## 2022-03-09 DIAGNOSIS — L03213 Periorbital cellulitis: Secondary | ICD-10-CM | POA: Diagnosis not present

## 2022-03-09 MED ORDER — ERYTHROMYCIN 5 MG/GM OP OINT: 1.0000 | TOPICAL_OINTMENT | Freq: Three times a day (TID) | OPHTHALMIC | 0 refills | Status: DC

## 2022-03-09 NOTE — Patient Instructions (Signed)
Please follow up if symptoms do not improve or as needed.    Preseptal Cellulitis, Adult Preseptal cellulitis is an infection of the eyelid and the tissues around the eye (periorbital area). The infection causes painful swelling and redness. This condition may also be called periorbital cellulitis. In most cases, the condition can be treated with antibiotic medicine at home. It is important to treat preseptal cellulitis right away so that it does not get worse. If it gets worse, it can spread to the eye socket and eye muscles (orbital cellulitis). Orbital cellulitis is a medical emergency. What are the causes? Preseptal cellulitis is most commonly caused by bacteria. In rare cases, it can be caused by a virus or fungus. The germs that cause preseptal cellulitis may come from: A sinus infection that spreads near the eyes. An injury near the eye, such as a scratch, puncture wound, animal bite, or insect bite. A skin rash, such as eczema or poison ivy, that becomes infected. An infected pimple on the eyelid (stye). Infection after eyelid surgery or injury. What increases the risk? You are more likely to develop this condition if: You have a weakened disease-fighting system (immune system). You have a medical condition that raises your risk for sinus infections, such as nasal polyps. What are the signs or symptoms? Symptoms of this condition include: Eyelids that are red and swollen and feel unusually hot. Fever. Difficulty opening the eye. Headache. Pain in the face. Symptoms of this condition usually develop suddenly. How is this diagnosed? This condition may be diagnosed based on your symptoms, your medical history, and an eye exam. You may also have tests, such as: Blood tests. Tests (cultures) to find out which specific bacteria are causing the infection. You may have a culture of any open wound or drainage. CT scan. MRI. This is less common. How is this treated? This condition is  treated with antibiotic medicines. These may be given by mouth (orally), through an IV, or as an injection. In rare cases, you may need surgery to drain an infected area. Follow these instructions at home: Medicines Take your antibiotic medicine as told by your health care provider. Do not stop taking the antibiotic even if you start to feel better Take over-the-counter and prescription medicines only as told by your health care provider. Eye Care Do not use eye drops without first getting approval from your health care provider. Do not touch or rub your eye. If you wear contact lenses, do not wear them until your health care provider approves. Keep the eye area clean and dry. Wash the eye area with a clean washcloth, warm water, and baby shampoo or mild soap. To help relieve discomfort, place a clean washcloth that is wet with warm water over your eye. Leave the washcloth on for a few minutes, then remove it. General instructions Wash your hands with soap and water often for at least 20 seconds. If soap and water are not available, use hand sanitizer. Do not use any products that contain nicotine or tobacco, such as cigarettes, e-cigarettes, and chewing tobacco. If you need help quitting, ask your health care provider. Drink enough fluid to keep your urine pale yellow. Do not drive or operate machinery until your health care provider says that it is safe. Ask your health care provider if it is safe for you to drive. Stay up to date on your vaccinations. Keep all follow-up visits. This includes any visits with an eye specialist (ophthalmologist) or dentist. This is important.  Get help right away if: You have new symptoms. Your symptoms get worse or do not get better with treatment. You have a fever. Your vision becomes blurry or gets worse in any way. Your eye looks like it is sticking out or bulging out (proptosis). You develop double vision. You have trouble moving your eyes or pain when  moving your eyes You have a severe headache. You have neck stiffness or severe neck pain. These symptoms may represent a serious problem that is an emergency. Do not wait to see if the symptoms will go away. Get medical help right away. Call your local emergency services (911 in the [place] Do not drive yourself to the hospital. Summary Preseptal cellulitis is an infection of the eyelid and the tissues around the eye. Symptoms of preseptal cellulitis usually develop suddenly and include red and swollen eyelids, fever, difficulty opening the eye, headache, and facial pain. This condition is treated with antibiotic medicines. Do not stop taking the antibiotic even if you start to feel better. Preseptal cellulitis can develop into orbital cellulitis, which is a medical emergency. If your condition does not improve or worsens, visit your heath care provider right away. This information is not intended to replace advice given to you by your health care provider. Make sure you discuss any questions you have with your health care provider. Document Revised: 05/03/2019 Document Reviewed: 05/03/2019 Elsevier Patient Education  [place].

## 2022-03-09 NOTE — Progress Notes (Signed)
Subjective  CC:  Chief Complaint  Patient presents with   Eye Problem    Pt feels stated that she got some paint stripper in her last Thursday 03/05/2022    HPI: Nancy Choi is a 57 y.o. female who presents to the office today to address the problems listed above in the chief complaint. New patient, I reviewed chart.  Healthy overall.  Reports she is working on redoing her kitchen, was scraping paint off of the ceiling beings in her kitchen.  Using the chemical to help remove the patient.  4 days ago, chemical and paint got in her left eye.  Since, she has had worsening soreness, redness and swelling of the upper eyelid.  She admits to mild photophobia but no true eye pain.  No redness of the eye.  No fevers  Assessment  1. Periorbital cellulitis of left eye      Plan  Preseptal cellulitis/induced by chemical irritant: No sign of corneal abrasion.  Patient is very hesitant to use oral antibiotics.  Will start with erythromycin ophthalmic ointment 3 times daily.  Educated on appropriate use.  Patient to monitor symptoms.  If swelling and redness are not decreasing in the next 48 hours, I recommend switching to oral antibiotics.  Patient understands and agrees.  See after visit summary for further education.  Follow up: As needed Visit date not found  No orders of the defined types were placed in this encounter.  Meds ordered this encounter  Medications   erythromycin ophthalmic ointment    Sig: Place 1 Application into the left eye 3 (three) times daily.    Dispense:  3.5 g    Refill:  0      I reviewed the patients updated PMH, FH, and SocHx.    Patient Active Problem List   Diagnosis Date Noted   Dietary counseling and surveillance 01/27/2022   Gastrointestinal complaints 01/27/2022   GERD (gastroesophageal reflux disease) 01/13/2022   Facial pain 06/16/2013   HYPOTHYROIDISM 08/14/2008   GENERALIZED ANXIETY DISORDER 08/14/2008   Depression 08/14/2008   Current  Meds  Medication Sig   ARMOUR THYROID 60 MG tablet Takes '60mg'$  in the morning and 60 mg at night.   Ascorbic Acid (VITAMIN C) 1000 MG tablet Take 1,000 mg by mouth daily.   Benzocaine-Resorcinol (VAGISIL) 5-2 % CREA Vagisil 5 %-2 % topical cream  PLACE VAGINALLY AT BEDTIME.   buPROPion (WELLBUTRIN XL) 150 MG 24 hr tablet Take 1 tablet (150 mg total) by mouth daily.   Cholecalciferol (VITAMIN D) 2000 UNITS CAPS Take by mouth 3 (three) times a week.    ciclopirox (PENLAC) 8 % solution Apply topically.   cyanocobalamin 100 MCG tablet Take by mouth.   erythromycin ophthalmic ointment Place 1 Application into the left eye 3 (three) times daily.   estradiol (ESTRACE) 1 MG tablet TAKE ONE AND A HALF TABLET BY MOUTH EVERY DAY   INTRAROSA 6.5 MG INST INSERT 1 APPLICATORFUL INTRAVAGINALLY AT BEDTIME NIGHTLY.   progesterone (PROMETRIUM) 100 MG capsule Take 150 mg by mouth daily.    RABEprazole (ACIPHEX) 20 MG tablet Take 20 mg by mouth 2 (two) times daily.   sucralfate (CARAFATE) 1 g tablet Take 1 tablet by mouth 4 (four) times daily.   triamcinolone cream (KENALOG) 0.1 % Apply to affected area twice a day   venlafaxine (EFFEXOR) 25 MG tablet Take 0.5 tablets (12.5 mg total) by mouth daily. Compound to total dose of 12.'5mg'$  from capsule or tablet as  per past history of taking it   vitamin E 100 UNIT capsule Take 100 Units by mouth daily.    Allergies: Patient is allergic to flagyl [metronidazole], codeine sulfate, other, and doxycycline. Family History: Patient family history includes Asthma in her maternal grandmother; Breast cancer (age of onset: 28) in her cousin; Breast cancer (age of onset: 48) in her mother; Depression in her mother; Diabetes in her paternal grandmother; Hyperlipidemia in her father; Hypertension in her father. Social History:  Patient  reports that she has never smoked. She has never used smokeless tobacco. She reports that she does not drink alcohol and does not use  drugs.  Review of Systems: Constitutional: Negative for fever malaise or anorexia Cardiovascular: negative for chest pain Respiratory: negative for SOB or persistent cough Gastrointestinal: negative for abdominal pain  Objective  Vitals: BP 94/60   Pulse 67   Temp 98 F (36.7 C)   Ht 5' 9.2" (1.758 m)   Wt 126 lb (57.2 kg)   LMP 10/06/2013   SpO2 99%   BMI 18.50 kg/m  General: no acute distress , A&Ox3 HEENT: PEERL, conjunctiva normal, neck is supple Left upper eyelid with redness, erythema and mild tenderness.  Mild crusting on upper eyelids.  No obvious drainage.  Cornea appears clear, minimal photophobia.  Conjunctiva is normal.  Lower eyelid is normal.   Commons side effects, risks, benefits, and alternatives for medications and treatment plan prescribed today were discussed, and the patient expressed understanding of the given instructions. Patient is instructed to call or message via MyChart if he/she has any questions or concerns regarding our treatment plan. No barriers to understanding were identified. We discussed Red Flag symptoms and signs in detail. Patient expressed understanding regarding what to do in case of urgent or emergency type symptoms.  Medication list was reconciled, printed and provided to the patient in AVS. Patient instructions and summary information was reviewed with the patient as documented in the AVS. This note was prepared with assistance of Dragon voice recognition software. Occasional wrong-word or sound-a-like substitutions may have occurred due to the inherent limitations of voice recognition software

## 2022-03-13 ENCOUNTER — Encounter: Payer: Self-pay | Admitting: Family Medicine

## 2022-03-18 MED ORDER — SULFAMETHOXAZOLE-TRIMETHOPRIM 800-160 MG PO TABS: 1.0000 | ORAL_TABLET | Freq: Two times a day (BID) | ORAL | 0 refills | Status: DC

## 2022-03-18 NOTE — Addendum Note (Signed)
Addended by: Billey Chang on: 03/18/2022 11:03 AM   Modules accepted: Orders

## 2022-04-21 ENCOUNTER — Other Ambulatory Visit: Payer: Self-pay | Admitting: Obstetrics and Gynecology

## 2022-04-21 DIAGNOSIS — R921 Mammographic calcification found on diagnostic imaging of breast: Secondary | ICD-10-CM

## 2022-04-28 ENCOUNTER — Ambulatory Visit (INDEPENDENT_AMBULATORY_CARE_PROVIDER_SITE_OTHER): Payer: 59 | Admitting: Family Medicine

## 2022-04-28 ENCOUNTER — Encounter: Payer: Self-pay | Admitting: Family Medicine

## 2022-04-28 VITALS — BP 110/70 | HR 79 | Temp 98.0°F | Resp 16 | Ht 69.2 in | Wt 121.4 lb

## 2022-04-28 DIAGNOSIS — M546 Pain in thoracic spine: Secondary | ICD-10-CM

## 2022-04-28 DIAGNOSIS — R208 Other disturbances of skin sensation: Secondary | ICD-10-CM | POA: Diagnosis not present

## 2022-04-28 MED ORDER — PREDNISONE 20 MG PO TABS: ORAL_TABLET | ORAL | 0 refills | Status: AC

## 2022-04-28 MED ORDER — CELECOXIB 100 MG PO CAPS: 100.0000 mg | ORAL_CAPSULE | Freq: Two times a day (BID) | ORAL | 0 refills | Status: AC

## 2022-04-28 NOTE — Progress Notes (Signed)
ACUTE VISIT Chief Complaint  Patient presents with   Back Pain    Upper back pain, started last Wednesday. Pain is mainly present when sitting down for long periods or laying down. Going on a 10 hour flight on Friday.   HPI: [name] is a 57 y.o. female with PMHx significant for GERD, hypothyroidism, GAD, and depression here today complaining of right-sided upper back pain as described above. Right interscapular pain, which started last Wednesday. She reports a history of sciatic nerve pain, but this current pain is different and radiated around her right side to lateral aspect, not affecting anterior chest wall.  Back Pain This is a new problem. The current episode started in the past 7 days. The problem occurs constantly. The problem is unchanged. The pain is present in the thoracic spine. The quality of the pain is described as burning and aching. The pain is at a severity of 8/10. The pain is severe. The pain is Worse during the night. The symptoms are aggravated by lying down. Associated symptoms include paresthesias. Pertinent negatives include no abdominal pain, bladder incontinence, bowel incontinence, chest pain, dysuria, fever, headaches, leg pain, numbness, pelvic pain, perianal numbness, weakness or weight loss.  The pain is tolerable when she is up and moving but becomes "miserable" when sitting or trying to sleep. She rates the pain as a 3/10 at the time of the visit and 7-8/10 when lying down.  She denies any numbness or tingling in the affected area.  No hx of trauma but she suspects the pain may be due to managing her dog, who does "jerk" and pull on the leash, but she does not recall a specific event.  She reports that she has a sensitive digestive system and has been advised by her gastroenterologist to avoid ibuprofen/Advil and naproxen. She has tried taking Tylenol but reports it provided no relief. She is currently using a Salonpas patch, which helps some. She  is concerned about havig a "pinched nerve", has had burning sensation.  Negative for fever, abnormal weight loss, night sweats,cough,or skin rash. No Hx of GI bleed.   Review of Systems  Constitutional:  Negative for appetite change, chills, fever and weight loss.  HENT:  Negative for sore throat and trouble swallowing.   Respiratory:  Negative for cough, shortness of breath and wheezing.   Cardiovascular:  Negative for chest pain.  Gastrointestinal:  Negative for abdominal pain, bowel incontinence, nausea and vomiting.  Genitourinary:  Negative for bladder incontinence, dysuria and pelvic pain.  Musculoskeletal:  Positive for back pain.  Skin:  Negative for rash.  Neurological:  Positive for paresthesias. Negative for weakness, numbness and headaches.  See other pertinent positives and negatives in HPI.  Current Outpatient Medications on File Prior to Visit  Medication Sig Dispense Refill   ARMOUR THYROID 60 MG tablet Takes  in the morning and 60 mg at night.     Ascorbic Acid (VITAMIN C) 1000 MG tablet Take 1,000 mg by mouth daily.     buPROPion (WELLBUTRIN XL) 150 MG 24 hr tablet Take 1 tablet (150 mg total) by mouth daily. 90 tablet 0   Cholecalciferol (VITAMIN D) 2000 UNITS CAPS Take by mouth 3 (three) times a week.      ciclopirox (PENLAC) 8 % solution Apply topically.     cyanocobalamin 100 MCG tablet Take by mouth.     estradiol (ESTRACE) 1 MG tablet TAKE ONE AND A HALF TABLET BY MOUTH EVERY DAY  INTRAROSA 6.5 MG INST INSERT 1 APPLICATORFUL INTRAVAGINALLY AT BEDTIME NIGHTLY.     progesterone (PROMETRIUM) 100 MG capsule Take 150 mg by mouth daily.      RABEprazole (ACIPHEX) 20 MG tablet Take 20 mg by mouth 2 (two) times daily.     triamcinolone cream (KENALOG) 0.1 % Apply to affected area twice a day     venlafaxine (EFFEXOR) 25 MG tablet Take 0.5 tablets (12.5 mg total) by mouth daily. Compound to total dose of 12.5mg  from capsule or tablet as per past history of taking  it 45 tablet 1   vitamin E 100 UNIT capsule Take 100 Units by mouth daily.     No current facility-administered medications on file prior to visit.    Past Medical History:  Diagnosis Date   Allergy    DEPRESSION 08/14/2008   Qualifier: Diagnosis of  By: Gabriel Rung LPN, Nancy     Eczema    Generalized anxiety disorder 08/14/2008   Qualifier: Diagnosis of  By: Gabriel Rung LPN, Harriett Sine     HYPOTHYROIDISM 08/14/2008   Qualifier: Diagnosis of  By: Gabriel Rung LPN, Harriett Sine     Allergies  Allergen Reactions   Flagyl [Metronidazole] Nausea Only   Codeine Sulfate     REACTION: irritable, bugs crawling on skin sensation   Other     Covid Vaccine    Doxycycline Nausea Only    Social History   Socioeconomic History   Marital status: Single    Spouse name: Not on file   Number of children: Not on file   Years of education: Not on file   Highest education level: Not on file  Occupational History   Not on file  Tobacco Use   Smoking status: Never   Smokeless tobacco: Never  Vaping Use   Vaping Use: Never used  Substance and Sexual Activity   Alcohol use: No    Alcohol/week: 0.0 standard drinks of alcohol   Drug use: No   Sexual activity: Not on file  Other Topics Concern   Not on file  Social History Narrative   Not on file   Social Determinants of Health   Financial Resource Strain: Not on file  Food Insecurity: Not on file  Transportation Needs: Not on file  Physical Activity: Not on file  Stress: Not on file  Social Connections: Not on file   Vitals:   04/28/22 1105  BP: 110/70  Pulse: 79  Resp: 16  Temp: 98 F (36.7 C)  SpO2: 98%   Body mass index is 17.82 kg/m.  Physical Exam Vitals and nursing note reviewed.  Constitutional:      General: She is not in acute distress.    Appearance: She is well-developed. She is not ill-appearing.  HENT:     Head: Normocephalic and atraumatic.  Eyes:     Conjunctiva/sclera: Conjunctivae normal.  Cardiovascular:     Rate and  Rhythm: Normal rate and regular rhythm.     Heart sounds: No murmur heard. Pulmonary:     Effort: Pulmonary effort is normal. No respiratory distress.     Breath sounds: Normal breath sounds.  Musculoskeletal:     Right shoulder: Normal range of motion.     Left shoulder: Normal range of motion.     Thoracic back: Tenderness present. No spasms or bony tenderness.       Back:     Comments: Pain is elicited with right UE abduction.  Lymphadenopathy:     Cervical: No cervical adenopathy.  Skin:  General: Skin is warm.     Findings: No erythema or rash.  Neurological:     General: No focal deficit present.     Mental Status: She is alert and oriented to person, place, and time.     Gait: Gait normal.  Psychiatric:        Mood and Affect: Affect normal. Mood is anxious.   ASSESSMENT AND PLAN:  Ms. Choi was seen today for right-sided upper back pain.  Acute right-sided thoracic back pain We discussed possible causes, ?muscle strain. Hx and examination do not suggest a serious process. I do not think imaging or blood work are needed at this time. She expresses reluctance towards using muscle relaxants unless the pain becomes significantly worse. No hx of PUD or GI bleed. She may tolerate Celebrex better than OTC NSAID's. Some side effects discussed. Celebrex 100 mg bid x 7-10 days.  -     Celecoxib; Take 1 capsule (100 mg total) by mouth 2 (two) times daily for 10 days.  Dispense: 20 capsule; Refill: 0  Burning sensation of skin Associated with thoracic pain. She is concerned about radicular pain, she has taken prednisone in the past and would like to try again. Sid effects of Prednisone discussed, instructed to take it with breakfast and not at the same time with Celebrex. Instructed about warning signs. Monitor for new symptoms,including rash.  -     predniSONE; 3 tabs for 2 days, 2 tabs for 3 days, 1 tabs for 3 days, and 1/2 tab for 3 days. Take tables together with  breakfast.  Dispense: 18 tablet; Refill: 0  Return if symptoms worsen or fail to improve.  Kaedence Connelly G. [place], MD  [facility]. [place] office.

## 2022-04-28 NOTE — Patient Instructions (Signed)
A few things to remember from today's visit:  Acute right-sided thoracic back pain - Plan: celecoxib (CELEBREX) 100 MG capsule  Paresthesia of skin - Plan: predniSONE (DELTASONE) 20 MG tablet  Take Prednisone with breakfast and not at the same time with Celebrex. Celebrex with food and 2 times per day for 7-10 days. Local massage and ice hot or asper cream.  If you need refills for medications you take chronically, please call your pharmacy. Do not use My Chart to request refills or for acute issues that need immediate attention. If you send a my chart message, it may take a few days to be addressed, specially if I am not in the office.  Please be sure medication list is accurate. If a new problem present, please set up appointment sooner than planned today.

## 2022-05-11 ENCOUNTER — Other Ambulatory Visit (HOSPITAL_COMMUNITY): Payer: Self-pay | Admitting: Psychiatry

## 2022-05-12 ENCOUNTER — Ambulatory Visit
Admission: RE | Admit: 2022-05-12 | Discharge: 2022-05-12 | Disposition: A | Discharge status: Home / Self Care | Payer: 59 | Source: Ambulatory Visit | Attending: Obstetrics and Gynecology | Admitting: Obstetrics and Gynecology

## 2022-05-12 DIAGNOSIS — R921 Mammographic calcification found on diagnostic imaging of breast: Secondary | ICD-10-CM

## 2022-05-22 ENCOUNTER — Other Ambulatory Visit: Payer: Self-pay | Admitting: Obstetrics and Gynecology

## 2022-05-22 DIAGNOSIS — Z1239 Encounter for other screening for malignant neoplasm of breast: Secondary | ICD-10-CM

## 2022-06-22 ENCOUNTER — Encounter (HOSPITAL_COMMUNITY): Payer: Self-pay | Admitting: Psychiatry

## 2022-06-22 ENCOUNTER — Telehealth (HOSPITAL_COMMUNITY): Payer: BC Managed Care – PPO | Admitting: Psychiatry

## 2022-06-22 DIAGNOSIS — F411 Generalized anxiety disorder: Secondary | ICD-10-CM

## 2022-06-22 DIAGNOSIS — F5102 Adjustment insomnia: Secondary | ICD-10-CM

## 2022-06-22 DIAGNOSIS — F331 Major depressive disorder, recurrent, moderate: Secondary | ICD-10-CM

## 2022-06-22 MED ORDER — BUPROPION HCL ER (XL) 150 MG PO TB24: 150.0000 mg | ORAL_TABLET | Freq: Every day | ORAL | 0 refills | Status: DC

## 2022-06-22 MED ORDER — VENLAFAXINE HCL 25 MG PO TABS: 12.5000 mg | ORAL_TABLET | Freq: Every day | ORAL | 1 refills | Status: DC

## 2022-06-22 NOTE — Progress Notes (Signed)
[facility] Follow up visit Patient Identification: Nancy Choi MRN:  [id number] Date of Evaluation:  06/22/2022 Referral Source: primary care Chief Complaint:  mood symptoms, insomnia follow up  Visit Diagnosis:    ICD-10-CM   1. MDD (major depressive disorder), recurrent episode, moderate (HCC)  F33.1     2. GAD (generalized anxiety disorder)  F41.1     3. Adjustment insomnia  F51.02      Virtual Visit via Video Note  I connected with [name] on 06/22/22 at  9:00 AM EDT by a video enabled telemedicine application and verified that I am speaking with the correct person using two identifiers.  Location: Patient: home Provider: home office   I discussed the limitations of evaluation and management by telemedicine and the availability of in person appointments. The patient expressed understanding and agreed to proceed.     I discussed the assessment and treatment plan with the patient. The patient was provided an opportunity to ask questions and all were answered. The patient agreed with the plan and demonstrated an understanding of the instructions.   The patient was advised to call back or seek an in-person evaluation if the symptoms worsen or if the condition fails to improve as anticipated.  I provided  minutes of non-face-to-face time during this encounter.          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 No kids Brother lives next door.  Had been laid off from company after 24 years due to some incident . That was surprising  She has found another job but pay cut Overall managing finances and depression is fair, moving forward  Parents are sick,   Overall sleep fair with sleep hygiene still need to be worked on  She keeps busy at home as well  Aggravating factor:  Past relationship, husband death, laid off, new job Modifying factor:  cats Duration :adult life Severity :fair Past  psych admission or suicide attempt: denies     Past Psychiatric History: depression, anxiety  Previous Psychotropic Medications: Yes     Past Medical History:  Past Medical History:  Diagnosis Date   Allergy    DEPRESSION 08/14/2008   Qualifier: Diagnosis of  By: Gabriel Rung LPN, Nancy     Eczema    Generalized anxiety disorder 08/14/2008   Qualifier: Diagnosis of  By: Gabriel Rung LPN, Harriett Sine     HYPOTHYROIDISM 08/14/2008   Qualifier: Diagnosis of  By: Gabriel Rung LPN, Harriett Sine      Past Surgical History:  Procedure Laterality Date   BREAST LUMPECTOMY Right    1985 benign   BREAST SURGERY      Family Psychiatric History: mom: mood symptoms , depression  Family History:  Family History  Problem Relation Age of Onset   Breast cancer Mother 75       metastic (-) BrCA gene. blood work shows ATM mutation in [place] (2022)   Depression Mother    Hyperlipidemia Father    Hypertension Father    Asthma Maternal Grandmother    Diabetes Paternal Grandmother    Breast cancer Cousin 39   Stomach cancer Neg Hx    Rectal cancer Neg Hx    Esophageal cancer Neg Hx    Colon polyps Neg Hx    Colon cancer Neg Hx     Social History:   Social History   Socioeconomic History   Marital status: Single    Spouse name: Not on  file   Number of children: Not on file   Years of education: Not on file   Highest education level: Not on file  Occupational History   Not on file  Tobacco Use   Smoking status: Never   Smokeless tobacco: Never  Vaping Use   Vaping Use: Never used  Substance and Sexual Activity   Alcohol use: No    Alcohol/week: 0.0 standard drinks of alcohol   Drug use: No   Sexual activity: Not on file  Other Topics Concern   Not on file  Social History Narrative   Not on file   Social Determinants of Health   Financial Resource Strain: Not on file  Food Insecurity: Not on file  Transportation Needs: Not on file  Physical Activity: Not on file  Stress: Not on file  Social  Connections: Not on file     Allergies:   Allergies  Allergen Reactions   Flagyl [Metronidazole] Nausea Only   Codeine Sulfate     REACTION: irritable, bugs crawling on skin sensation   Other     Covid Vaccine    Doxycycline Nausea Only    Metabolic Disorder Labs: No results found for: "HGBA1C", "MPG" No results found for: "PROLACTIN" Lab Results  Component Value Date   CHOL 193 04/30/2020   TRIG 38.0 04/30/2020   HDL 92.90 04/30/2020   CHOLHDL 2 04/30/2020   VLDL 7.6 04/30/2020   LDLCALC 93 04/30/2020   LDLCALC 97 07/08/2018   Lab Results  Component Value Date   TSH 0.02 (L) 04/30/2020    Therapeutic Level Labs: No results found for: "LITHIUM" No results found for: "CBMZ" No results found for: "VALPROATE"  Current Medications: Current Outpatient Medications  Medication Sig Dispense Refill   ARMOUR THYROID 60 MG tablet Takes 60mg  in the morning and 60 mg at night.     Ascorbic Acid (VITAMIN C) 1000 MG tablet Take 1,000 mg by mouth daily.     buPROPion (WELLBUTRIN XL) 150 MG 24 hr tablet Take 1 tablet (150 mg total) by mouth daily. 90 tablet 0   Cholecalciferol (VITAMIN D) 2000 UNITS CAPS Take by mouth 3 (three) times a week.      ciclopirox (PENLAC) 8 % solution Apply topically.     cyanocobalamin 100 MCG tablet Take by mouth.     estradiol (ESTRACE) 1 MG tablet TAKE ONE AND A HALF TABLET BY MOUTH EVERY DAY     INTRAROSA 6.5 MG INST INSERT 1 APPLICATORFUL INTRAVAGINALLY AT BEDTIME NIGHTLY.     progesterone (PROMETRIUM) 100 MG capsule Take 150 mg by mouth daily.      RABEprazole (ACIPHEX) 20 MG tablet Take 20 mg by mouth 2 (two) times daily.     triamcinolone cream (KENALOG) 0.1 % Apply to affected area twice a day     venlafaxine (EFFEXOR) 25 MG tablet Take 0.5 tablets (12.5 mg total) by mouth daily. Compound to total dose of 12.5mg  from capsule or tablet as per past history of taking it 45 tablet 1   vitamin E 100 UNIT capsule Take 100 Units by mouth daily.      No current facility-administered medications for this visit.      Psychiatric Specialty Exam: Review of Systems  Cardiovascular:  Negative for chest pain.  Psychiatric/Behavioral:  Negative for agitation.     Last menstrual period 10/06/2013.There is no height or weight on file to calculate BMI.  General Appearance: Casual  Eye Contact:  Good  Speech:  Clear and Coherent  Volume:  Normal  Mood: fair  Affect:  Congruent  Thought Process:  Goal Directed  Orientation:  Full (Time, Place, and Person)  Thought Content:  Rumination  Suicidal Thoughts:  No  Homicidal Thoughts:  No  Memory:  Immediate;   Fair Recent;   Fair  Judgement:  Fair  Insight:  Fair  Psychomotor Activity:  Normal  Concentration:  Concentration: Fair and Attention Span: Fair  Recall:  Good  Fund of Knowledge:Good  Language: Good  Akathisia:  No  Handed:    AIMS (if indicated):  not done  Assets:  Communication Skills Desire for Improvement Financial Resources/Insurance Housing Physical Health  ADL's:  Intact  Cognition: WNL  Sleep:   variable   Screenings: GAD-7    Flowsheet Row Office Visit from 04/28/2022 in [facility] [place] Office Visit from 03/09/2022 in [facility]  Total GAD-7 Score 7 0      PHQ2-9    Flowsheet Row Office Visit from 04/28/2022 in [facility] [place] Office Visit from 03/09/2022 in [facility] [employer] from 01/13/2022 in [facility] [employer] from 07/30/2021 in [facility] [employer] from 07/08/2018 in [facility] [place]  PHQ-2 Total Score 1 0 0 0 6  PHQ-9 Total Score 3 0 0 9 14      Flowsheet Row Video Visit from 09/01/2021 in [facility] Video Visit from 04/02/2021 in [facility] Video Visit from 09/30/2020 in [facility] at [facility]  C-SSRS RISK CATEGORY No Risk No Risk No Risk       Assessment and Plan: as follows  Prior documentation reviewed  MDD recurrent moderate: fair, despite was laid off had find another job, continue wellbutrin  GAD ;stressed but manageable continue effexor small dose  Insomnia: fair, but wakes up early, discussed sleep hygiene to follow  Fu  12m. Marland Kitchen Refills if due were sent and meds discussed,  Thresa Ross, MD 6/10/20249:11 AM

## 2022-07-21 DIAGNOSIS — M2550 Pain in unspecified joint: Secondary | ICD-10-CM | POA: Diagnosis not present

## 2022-07-21 DIAGNOSIS — E559 Vitamin D deficiency, unspecified: Secondary | ICD-10-CM | POA: Diagnosis not present

## 2022-07-21 DIAGNOSIS — N951 Menopausal and female climacteric states: Secondary | ICD-10-CM | POA: Diagnosis not present

## 2022-07-21 DIAGNOSIS — E538 Deficiency of other specified B group vitamins: Secondary | ICD-10-CM | POA: Diagnosis not present

## 2022-07-21 DIAGNOSIS — E7849 Other hyperlipidemia: Secondary | ICD-10-CM | POA: Diagnosis not present

## 2022-07-21 DIAGNOSIS — Z0001 Encounter for general adult medical examination with abnormal findings: Secondary | ICD-10-CM | POA: Diagnosis not present

## 2022-07-21 DIAGNOSIS — E039 Hypothyroidism, unspecified: Secondary | ICD-10-CM | POA: Diagnosis not present

## 2022-07-30 DIAGNOSIS — Z0001 Encounter for general adult medical examination with abnormal findings: Secondary | ICD-10-CM | POA: Diagnosis not present

## 2022-07-30 DIAGNOSIS — N951 Menopausal and female climacteric states: Secondary | ICD-10-CM | POA: Diagnosis not present

## 2022-07-30 DIAGNOSIS — E538 Deficiency of other specified B group vitamins: Secondary | ICD-10-CM | POA: Diagnosis not present

## 2022-07-30 DIAGNOSIS — E039 Hypothyroidism, unspecified: Secondary | ICD-10-CM | POA: Diagnosis not present

## 2022-09-18 ENCOUNTER — Ambulatory Visit (INDEPENDENT_AMBULATORY_CARE_PROVIDER_SITE_OTHER): Payer: BC Managed Care – PPO | Admitting: Family Medicine

## 2022-09-18 ENCOUNTER — Encounter: Payer: Self-pay | Admitting: Family Medicine

## 2022-09-18 ENCOUNTER — Telehealth: Payer: Self-pay

## 2022-09-18 VITALS — BP 110/60 | HR 88 | Temp 97.5°F | Ht 69.0 in | Wt 125.3 lb

## 2022-09-18 DIAGNOSIS — F411 Generalized anxiety disorder: Secondary | ICD-10-CM | POA: Diagnosis not present

## 2022-09-18 DIAGNOSIS — E039 Hypothyroidism, unspecified: Secondary | ICD-10-CM

## 2022-09-18 DIAGNOSIS — B351 Tinea unguium: Secondary | ICD-10-CM | POA: Diagnosis not present

## 2022-09-18 DIAGNOSIS — Z79899 Other long term (current) drug therapy: Secondary | ICD-10-CM | POA: Diagnosis not present

## 2022-09-18 LAB — HEPATIC FUNCTION PANEL
ALT: 14 U/L (ref 0–35)
AST: 17 U/L (ref 0–37)
Albumin: 4 g/dL (ref 3.5–5.2)
Alkaline Phosphatase: 91 U/L (ref 39–117)
Bilirubin, Direct: 0.1 mg/dL (ref 0.0–0.3)
Total Bilirubin: 0.4 mg/dL (ref 0.2–1.2)
Total Protein: 7.1 g/dL (ref 6.0–8.3)

## 2022-09-18 MED ORDER — BUPROPION HCL ER (XL) 150 MG PO TB24: 150.0000 mg | ORAL_TABLET | Freq: Every day | ORAL | 1 refills | Status: DC

## 2022-09-18 MED ORDER — VENLAFAXINE HCL 25 MG PO TABS: 12.5000 mg | ORAL_TABLET | Freq: Every day | ORAL | 1 refills | Status: DC

## 2022-09-18 NOTE — Telephone Encounter (Signed)
I spoke with the patient and she reported she has this checked in July and results were within normal limits

## 2022-09-18 NOTE — Progress Notes (Signed)
Established Patient Office Visit  Subjective   Patient ID: Nancy Choi, female    DOB: [birth year]  Age: 57 y.o. MRN: [id number]  Chief Complaint  Patient presents with   Medical Management of Chronic Issues    HPI   [name] is seen for medical follow-up.  She has history of recurrent depression and generalized anxiety and has been followed by psychiatrist past couple years.  She is wishing to transfer prescriptions here.  She takes Wellbutrin XL 150 mg once daily and Effexor 25 mg 1/2 tablet daily compounded.  This seems to be controlling her anxiety and depression symptoms well.  She had some recent stressors with losing job of 25 years and currently has a new job which has not been very rewarding.  She is looking at new employment soon hopefully.  She has history of onychomycosis involving her left fourth digit.  Previously was prescribed Penlac per dermatologist but did not do much good.  She would like to explore other options.  Has never tried systemic with Lamisil.  She has had some ongoing GERD symptoms and takes Aciphex.  No dysphagia.  Weight stable.  Past Medical History:  Diagnosis Date   Allergy    DEPRESSION 08/14/2008   Qualifier: Diagnosis of  By: Gabriel Rung LPN, Nancy     Eczema    Generalized anxiety disorder 08/14/2008   Qualifier: Diagnosis of  By: Gabriel Rung LPN, Harriett Sine     HYPOTHYROIDISM 08/14/2008   Qualifier: Diagnosis of  By: Gabriel Rung LPN, Harriett Sine     Past Surgical History:  Procedure Laterality Date   BREAST LUMPECTOMY Right    1985 benign   BREAST SURGERY      reports that she has never smoked. She has never used smokeless tobacco. She reports that she does not drink alcohol and does not use drugs. family history includes Asthma in her maternal grandmother; Breast cancer (age of onset: 65) in her cousin; Breast cancer (age of onset: 55) in her mother; Depression in her mother; Diabetes in her paternal grandmother; Hyperlipidemia in her father; Hypertension in  her father. Allergies  Allergen Reactions   Flagyl [Metronidazole] Nausea Only   Codeine Sulfate     REACTION: irritable, bugs crawling on skin sensation   Other     Covid Vaccine    Doxycycline Nausea Only    Review of Systems  Constitutional:  Negative for malaise/fatigue.  Eyes:  Negative for blurred vision.  Respiratory:  Negative for shortness of breath.   Cardiovascular:  Negative for chest pain.  Neurological:  Negative for dizziness, weakness and headaches.      Objective:     BP 110/60 (BP Location: Left Arm, Patient Position: Sitting, Cuff Size: Normal)   Pulse 88   Temp (!) 97.5 F (36.4 C) (Oral)   Ht 5\' 9"  (1.753 m)   Wt 125 lb 4.8 oz (56.8 kg)   LMP 10/06/2013   SpO2 98%   BMI 18.50 kg/m  BP Readings from Last 3 Encounters:  09/18/22 110/60  04/28/22 110/70  03/09/22 94/60   Wt Readings from Last 3 Encounters:  09/18/22 125 lb 4.8 oz (56.8 kg)  04/28/22 121 lb 6 oz (55.1 kg)  03/09/22 126 lb (57.2 kg)      Physical Exam Vitals reviewed.  Constitutional:      Appearance: Normal appearance.  Cardiovascular:     Rate and Rhythm: Normal rate and regular rhythm.     Heart sounds: No murmur heard. Pulmonary:  Effort: Pulmonary effort is normal.     Breath sounds: Normal breath sounds. No wheezing or rales.  Skin:    Comments: Left fourth toe nail reveals thickened and brittle changes  Neurological:     Mental Status: She is alert.      No results found for any visits on 09/18/22.    The 10-year ASCVD risk score (Arnett DK, et al., 2019) is: 1.1%    Assessment & Plan:   #1 history of recurrent depression and anxiety currently stable on Effexor and Wellbutrin XL.  She is on very low-dose of Effexor.  She is wishing to transition these prescriptions to [place].  We agreed to do so.  Refill both medications for 6 months.  #2 onychomycosis left fourth toenail.  Did not have good success previously with Penlac.  Good success previously with  Penlac.  Will prescribe Lamisil 250 mg 1 tablet daily for 3 months if her hepatic panel is normal.  #3 history of hypothyroidism.  We forgot to inquire whether she is getting this monitored elsewhere when she was here today.  We will need to repeat TSH as she has not had this monitored elsewhere   No follow-ups on file.    Evelena Peat, MD

## 2022-09-18 NOTE — Telephone Encounter (Signed)
-----   Message from Evelena Peat sent at 09/18/2022 12:17 PM EDT ----- I forgot to ask patient when she was here if she had recently gotten her thyroid assessed elsewhere.  If not, we will need to get TSH. Don't know if they could add it to labs

## 2022-09-23 MED ORDER — TERBINAFINE HCL 250 MG PO TABS: 250.0000 mg | ORAL_TABLET | Freq: Every day | ORAL | 0 refills | Status: AC

## 2022-09-23 NOTE — Addendum Note (Signed)
Addended by: Christy Sartorius on: 09/23/2022 01:12 PM   Modules accepted: Orders

## 2022-10-21 DIAGNOSIS — L821 Other seborrheic keratosis: Secondary | ICD-10-CM | POA: Diagnosis not present

## 2022-10-21 DIAGNOSIS — L814 Other melanin hyperpigmentation: Secondary | ICD-10-CM | POA: Diagnosis not present

## 2022-10-21 DIAGNOSIS — Z85828 Personal history of other malignant neoplasm of skin: Secondary | ICD-10-CM | POA: Diagnosis not present

## 2022-10-21 DIAGNOSIS — D225 Melanocytic nevi of trunk: Secondary | ICD-10-CM | POA: Diagnosis not present

## 2022-10-30 DIAGNOSIS — K5900 Constipation, unspecified: Secondary | ICD-10-CM | POA: Diagnosis not present

## 2022-10-30 DIAGNOSIS — K219 Gastro-esophageal reflux disease without esophagitis: Secondary | ICD-10-CM | POA: Diagnosis not present

## 2022-11-04 ENCOUNTER — Encounter: Payer: Self-pay | Admitting: Obstetrics and Gynecology

## 2022-11-12 IMAGING — MG DIGITAL DIAGNOSTIC BILAT W/ TOMO W/ CAD
9 of 12 series · 9 of 28 positions shown · non-contrast
Comparison: Previous exam(s).

CLINICAL DATA: 55-year-old female for 1 year follow-up of LEFT
breast calcifications and for annual bilateral mammogram.

EXAM:
DIGITAL DIAGNOSTIC BILATERAL MAMMOGRAM WITH CAD AND TOMO

[L ML]
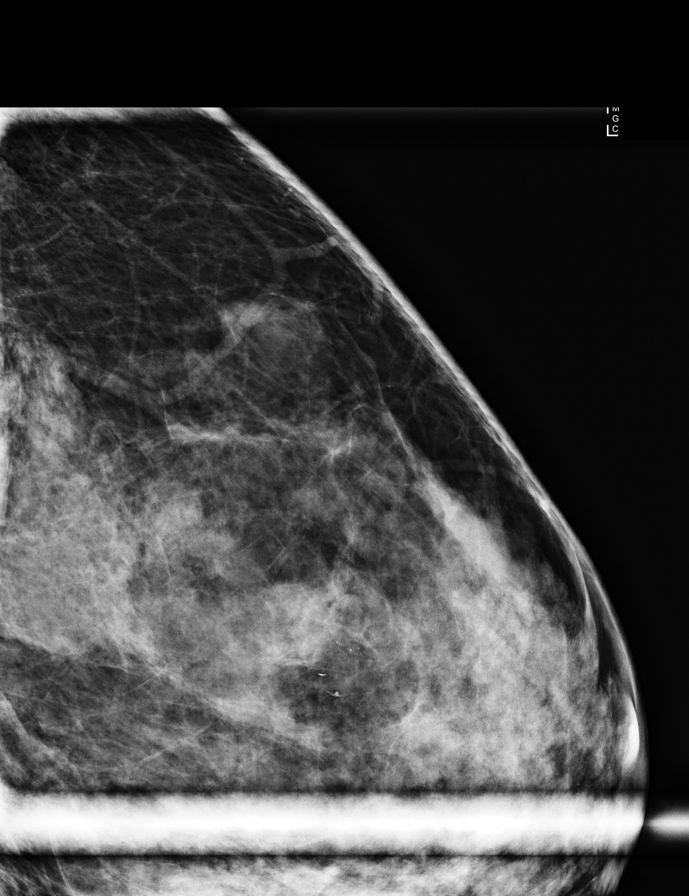

[L CC (1 of 3)]
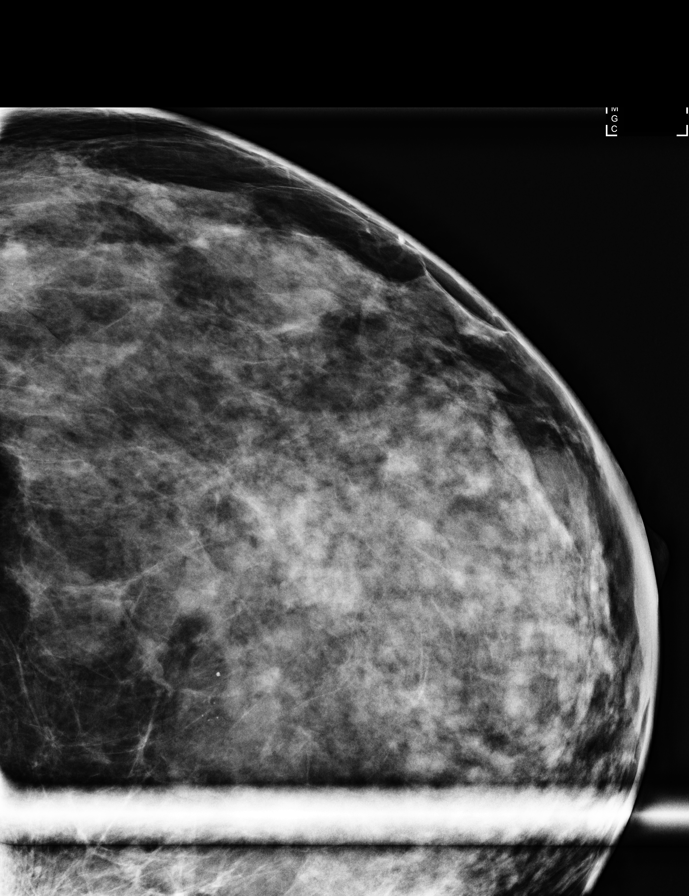

[L CC (2 of 3)]
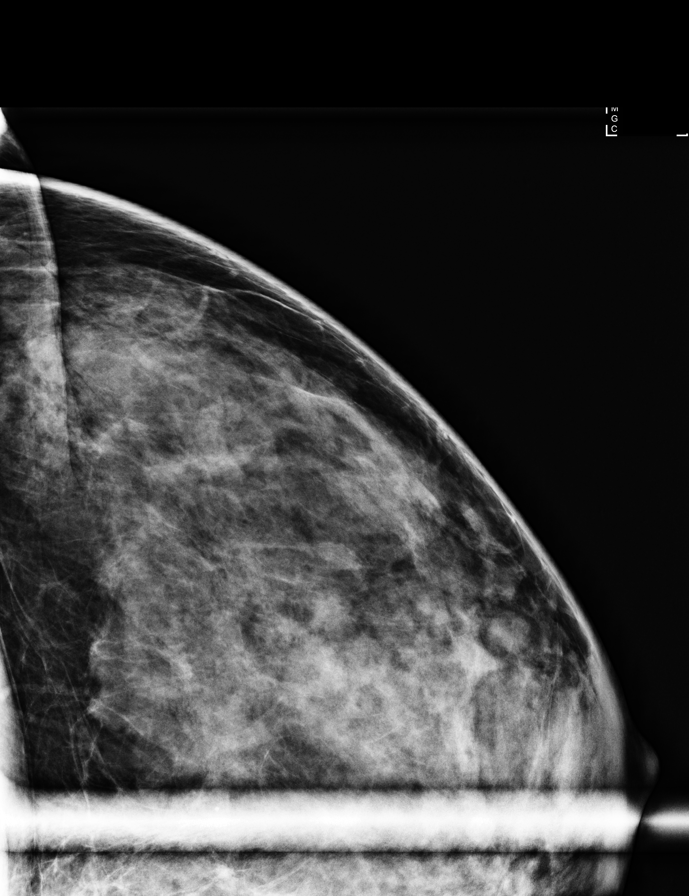

[L CC (3 of 3)]
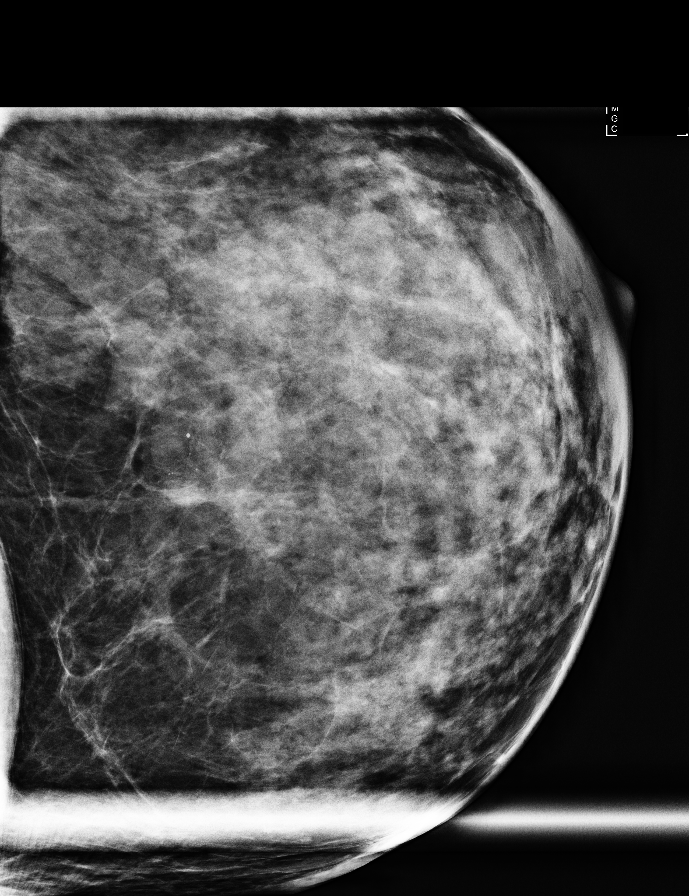

[R MLO synth-2D]
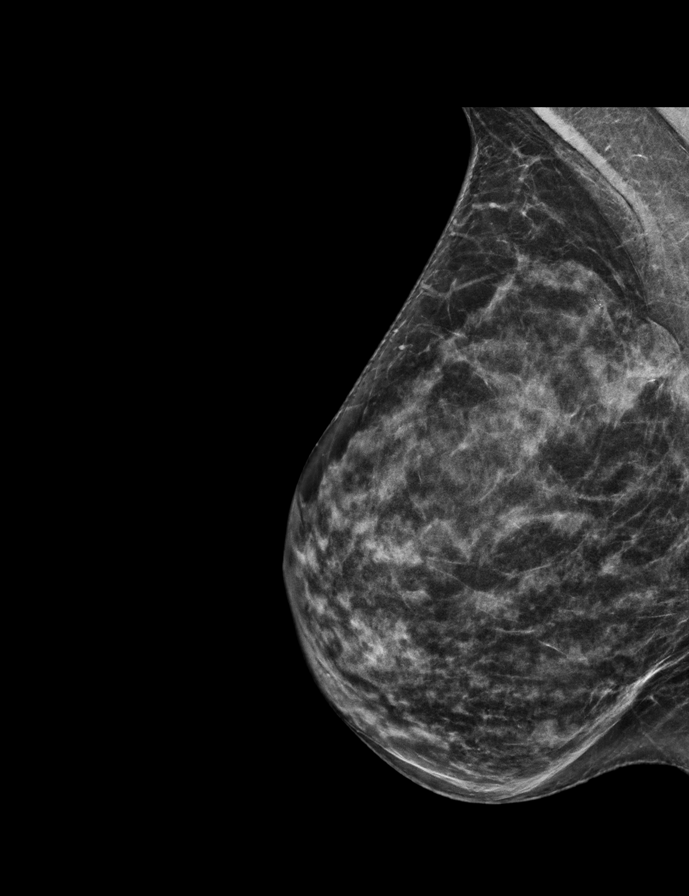

[R CC synth-2D]
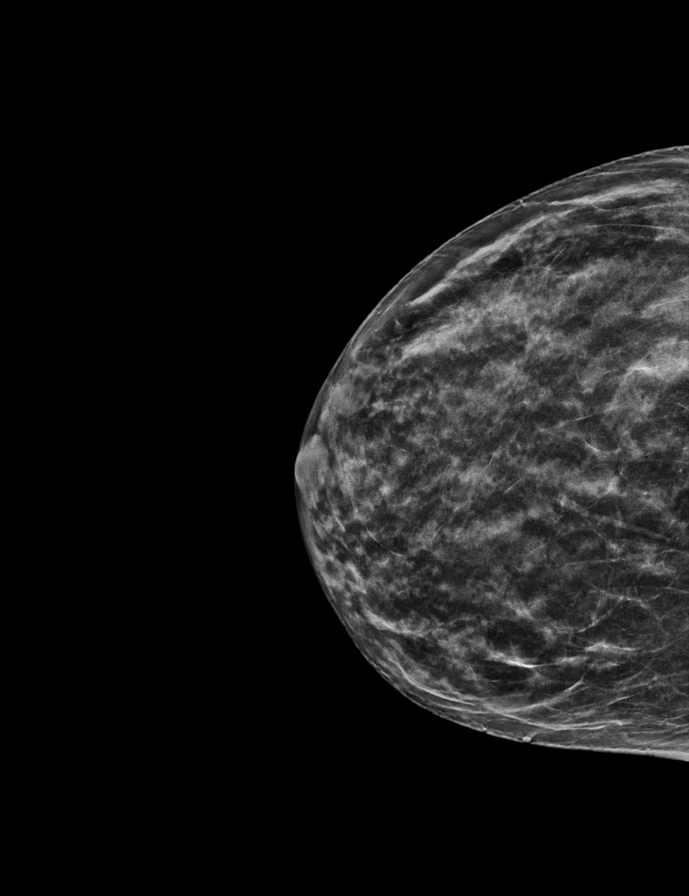

[L CC synth-2D]
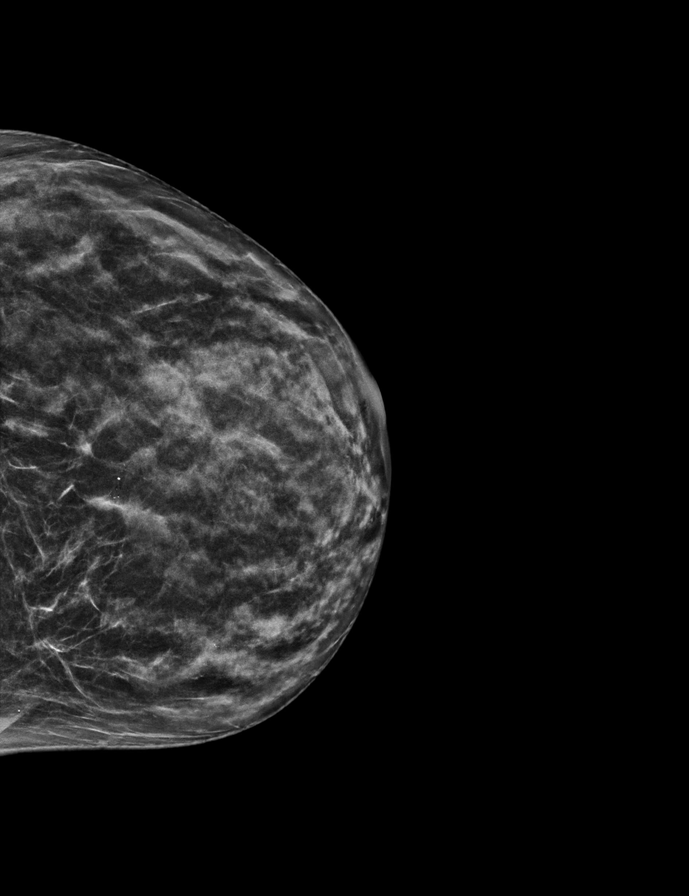

[L MLO synth-2D]
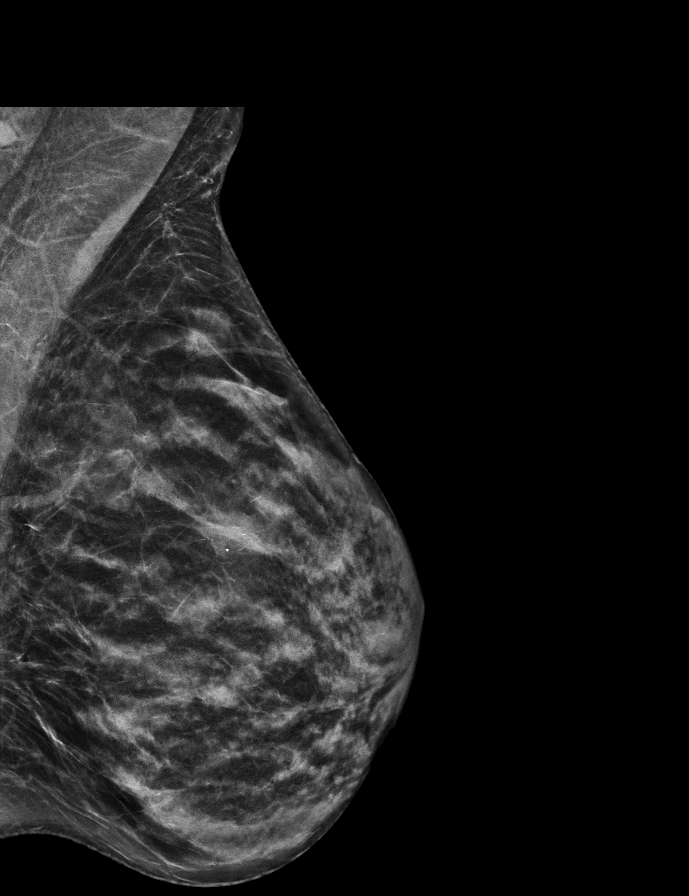

[R CC tomo · tomo slice 25/49.0]
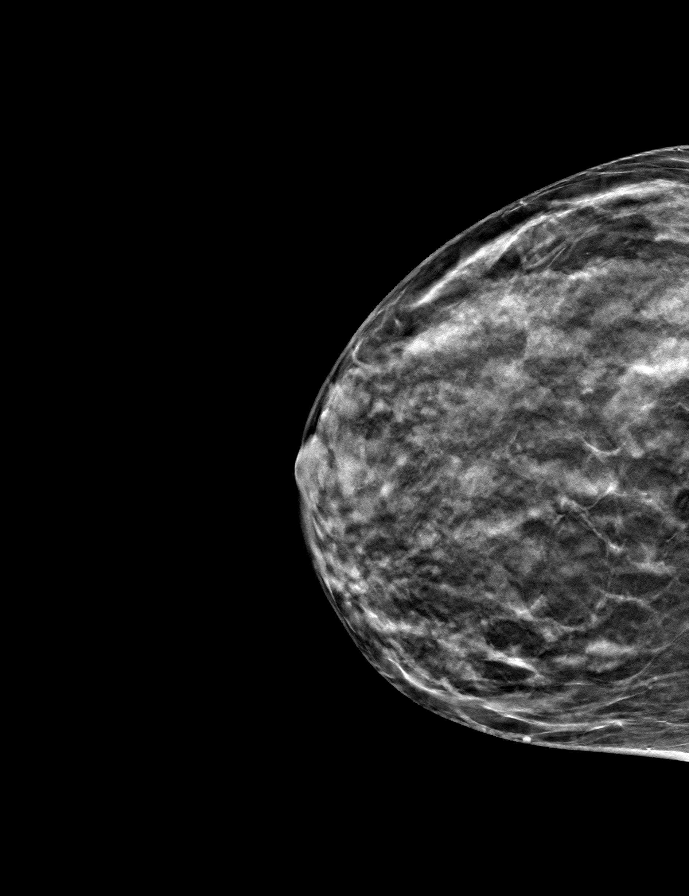

[9 of 28 positions shown; findings below may reference images not displayed]

ACR Breast Density Category c: The breast tissue is heterogeneously
dense, which may obscure small masses.
FINDINGS: Full field views of both breasts and magnification views of the LEFT
breast demonstrate a stable group of punctate calcifications within
the UPPER LEFT breast, some which layer.

No new or suspicious mammographic findings are noted within either
breast.

Mammographic images were processed with CAD.
IMPRESSION: .

1. Stable likely benign UPPER LEFT breast calcifications. One year
follow-up is recommended to ensure 2 year stability.
2. No new or suspicious mammographic findings within either breast.

RECOMMENDATION:
Bilateral diagnostic mammogram with magnification views of the LEFT
breast in 1 year.

I have discussed the findings and recommendations with the patient.
If applicable, a reminder letter will be sent to the patient
regarding the next appointment.

BI-RADS CATEGORY  3: Probably benign.

## 2022-11-16 ENCOUNTER — Other Ambulatory Visit: Payer: BC Managed Care – PPO

## 2022-12-21 ENCOUNTER — Telehealth (HOSPITAL_COMMUNITY): Payer: 59 | Admitting: Psychiatry

## 2023-01-02 ENCOUNTER — Ambulatory Visit
Admission: RE | Admit: 2023-01-02 | Discharge: 2023-01-02 | Disposition: A | Discharge status: Home / Self Care | Payer: BC Managed Care – PPO | Source: Ambulatory Visit | Attending: Obstetrics and Gynecology | Admitting: Obstetrics and Gynecology

## 2023-01-02 DIAGNOSIS — Z1239 Encounter for other screening for malignant neoplasm of breast: Secondary | ICD-10-CM | POA: Diagnosis not present

## 2023-01-02 DIAGNOSIS — Z803 Family history of malignant neoplasm of breast: Secondary | ICD-10-CM | POA: Diagnosis not present

## 2023-01-02 MED ORDER — GADOPICLENOL 0.5 MMOL/ML IV SOLN
6.0000 mL | Freq: Once | INTRAVENOUS | Status: AC | PRN
  Administered 2023-01-02: 6 mL via INTRAVENOUS

## 2023-01-12 ENCOUNTER — Other Ambulatory Visit: Payer: Self-pay | Admitting: Family Medicine

## 2023-01-12 NOTE — Telephone Encounter (Signed)
 Copied from CRM [id number]. Topic: Clinical - Medication Refill >> Jan 12, 2023 11:15 AM Benton KIDD wrote: Most Recent Primary Care Visit:  Provider: MICHEAL WOLM ORN  Department: [facility]  Visit Type: OFFICE VISIT  Date: 09/18/2022  Medication: ***  Has the patient contacted their pharmacy?  (Agent: If no, request that the patient contact the pharmacy for the refill. If patient does not wish to contact the pharmacy document the reason why and proceed with request.) (Agent: If yes, when and what did the pharmacy advise?)  Is this the correct pharmacy for this prescription?  If no, delete pharmacy and type the correct one.  This is the patient's preferred pharmacy:  [facility] [place], [place] - [address] [place] [address] Phone: [phone] Fax: [phone]  [facility] [phone] [place], [place] - [address] [facility] [address] [place] [address] Phone: [phone] Fax: [phone]   Has the prescription been filled recently?   Is the patient out of the medication?   Has the patient been seen for an appointment in the last year OR does the patient have an upcoming appointment?   Can we respond through MyChart?   Agent: Please be advised that Rx refills may take up to 3 business days. We ask that you follow-up with your pharmacy.

## 2023-01-14 ENCOUNTER — Telehealth: Payer: Self-pay

## 2023-01-14 NOTE — Telephone Encounter (Signed)
 Copied from CRM [id number]. Topic: Clinical - Prescription Issue >> Jan 12, 2023 11:13 AM Benton KIDD wrote: Reason for CRM: patient is calling concerning prescription that was filled for a year and when going to the pharmacy they told patient it was denied venlafaxine  (EFFEXOR ) 25 MG tablet. I did send a refill request to clinical pool.

## 2023-01-15 MED ORDER — VENLAFAXINE HCL 25 MG PO TABS: 12.5000 mg | ORAL_TABLET | Freq: Every day | ORAL | 1 refills | Status: DC

## 2023-01-19 NOTE — Telephone Encounter (Signed)
 Rx was sent on 01/15/2023

## 2023-02-10 DIAGNOSIS — K5900 Constipation, unspecified: Secondary | ICD-10-CM | POA: Diagnosis not present

## 2023-02-10 DIAGNOSIS — K219 Gastro-esophageal reflux disease without esophagitis: Secondary | ICD-10-CM | POA: Diagnosis not present

## 2023-03-16 ENCOUNTER — Ambulatory Visit: Payer: Self-pay | Admitting: Family Medicine

## 2023-03-16 NOTE — Telephone Encounter (Signed)
 Appt scheduled

## 2023-03-16 NOTE — Telephone Encounter (Signed)
 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 Pt reports 6/10 burning in her chest up to her esophagus, denies SOB. Nausea, dry heaving (pt reports she has not been able to vomit since she was 10.), dizziness, blurry vision. This RN advised pt to proceed to ED for eval/treat. Pt declines reporting she does not have insurance and this "feels the same as before". Attempted to call [employer] to notify of refusal. Office closed. This RN educated pt on home care, new-worsening symptoms, when to call back/seek emergent care. Pt verbalized understanding and agrees to plan.    Copied from CRM [id number]. Topic: Clinical - Red Word Triage >> Mar 16, 2023  4:08 PM Sim Boast F wrote: Red Word that prompted transfer to Nurse Triage: Patent says she has LPR/GURD, throat feels dry and has burning sensation, nausea, stomach aches and fatigue, would like to set up appointment and have blood test Reason for Disposition  Dizziness or lightheadedness  Answer Assessment - Initial Assessment Questions 1. LOCATION: "Where does it hurt?"       Middle of chest into throat 2. RADIATION: "Does the pain go anywhere else?" (e.g., into neck, jaw, arms, back)     None 3. ONSET: "When did the chest pain begin?" (Minutes, hours or days)      Worsening for the last 6 weeks 4. PATTERN: "Does the pain come and go, or has it been constant since it started?"  "Does it get worse with exertion?"      Constant 5. DURATION: "How long does it last" (e.g.,  seconds, minutes, hours)     Constant 6. SEVERITY: "How bad is the pain?"  (e.g., Scale 1-10; mild, moderate, or severe)    - MILD (1-3): doesn't interfere with normal activities     - MODERATE (4-7): interferes with normal activities or awakens from sleep    - SEVERE (8-10): excruciating pain, unable to do any normal activities       6/10 7. CARDIAC RISK FACTORS: "Do you have any history of heart problems or risk factors for heart disease?" (e.g., angina, prior heart attack; diabetes, high blood pressure, high cholesterol, smoker, or strong family history of heart disease)     None 8. PULMONARY RISK FACTORS: "Do you have any history of lung disease?"  (e.g., blood clots in lung, asthma, emphysema, birth control pills)     None 9. CAUSE: "What do you think is causing the chest pain?"     Unknown 10. OTHER SYMPTOMS: "Do you have any other symptoms?" (e.g., dizziness, nausea, vomiting, sweating, fever, difficulty breathing, cough)       Nausea, dry heaving but unable to vomit, belching, dizziness, fuzzy vision at times  Protocols used: Chest Pain-A-AH

## 2023-03-17 ENCOUNTER — Encounter: Payer: Self-pay | Admitting: Family Medicine

## 2023-03-17 ENCOUNTER — Ambulatory Visit: Admitting: Family Medicine

## 2023-03-17 VITALS — BP 104/72 | HR 75 | Temp 97.6°F | Wt 124.9 lb

## 2023-03-17 DIAGNOSIS — E039 Hypothyroidism, unspecified: Secondary | ICD-10-CM | POA: Diagnosis not present

## 2023-03-17 DIAGNOSIS — K5900 Constipation, unspecified: Secondary | ICD-10-CM | POA: Diagnosis not present

## 2023-03-17 DIAGNOSIS — K219 Gastro-esophageal reflux disease without esophagitis: Secondary | ICD-10-CM

## 2023-03-17 DIAGNOSIS — R1084 Generalized abdominal pain: Secondary | ICD-10-CM

## 2023-03-17 DIAGNOSIS — G43109 Migraine with aura, not intractable, without status migrainosus: Secondary | ICD-10-CM

## 2023-03-17 MED ORDER — PANTOPRAZOLE SODIUM 40 MG PO TBEC
40.0000 mg | DELAYED_RELEASE_TABLET | Freq: Every day | ORAL | 2 refills | Status: AC
Start: 2023-03-17 — End: ?

## 2023-03-17 NOTE — Progress Notes (Signed)
 Established Patient Office Visit  Subjective   Patient ID: Nancy Choi, female    DOB: [birth year]  Age: 58 y.o. MRN: [id number]  Chief Complaint  Patient presents with   Gastroesophageal Reflux    HPI   Nancy Choi is seen with multiple GI symptoms.  She has past medical history significant for GERD, hypothyroidism, recurrent depression.  She has seen GI specialist in the past.  She has had some recent issues with nausea without vomiting, frequent belching, bloating, diffuse abdominal cramping sensation, and burning sensation coming up into her esophagus.  She is seen [facility] specialist in the past and reportedly had EGD 2023.  No major findings then.  She has been on Aciphex 20 mg up to twice daily without much improvement.  Did not see any benefit with addition of Pepcid.  Has recently taken some align without any benefit.  Also has chronic constipation.  Has been taking alternately Senokot, MiraLAX, milk of magnesia.  Was prescribed motility agent previously but cost was prohibitive.  She had colonoscopy 2020 which was unremarkable.  She does have history of hypothyroidism and is on [place] Thyroid.  Followed at [facility].  Reportedly had T4 and T3 there last summer.  Last TSH here was low  She also has separate issue of some recent episodes of intermittent bilateral blurred vision.  Episodes usually last about 30 minutes.  No associated headache.  No diplopia.  No history of ocular migraines to her knowledge.  She was having episode even here today but starting to clear.  No other focal neurologic symptoms.  Past Medical History:  Diagnosis Date   Allergy    DEPRESSION 08/14/2008   Qualifier: Diagnosis of  By: Gabriel Rung LPN, Nancy     Eczema    Generalized anxiety disorder 08/14/2008   Qualifier: Diagnosis of  By: Gabriel Rung LPN, Harriett Sine     HYPOTHYROIDISM 08/14/2008   Qualifier: Diagnosis of  By: Gabriel Rung LPN, Harriett Sine     Past Surgical History:   Procedure Laterality Date   BREAST LUMPECTOMY Right    1985 benign   BREAST SURGERY      reports that she has never smoked. She has never used smokeless tobacco. She reports that she does not drink alcohol and does not use drugs. family history includes Asthma in her maternal grandmother; Breast cancer (age of onset: 68) in her cousin; Breast cancer (age of onset: 72) in her mother; Depression in her mother; Diabetes in her paternal grandmother; Hyperlipidemia in her father; Hypertension in her father. Allergies  Allergen Reactions   Flagyl [Metronidazole] Nausea Only   Codeine Sulfate     REACTION: irritable, bugs crawling on skin sensation   Other     Covid Vaccine    Doxycycline Nausea Only    Review of Systems  Constitutional:  Negative for chills and fever.  Eyes:  Positive for blurred vision. Negative for double vision, photophobia, pain, discharge and redness.  Respiratory:  Negative for shortness of breath.   Cardiovascular:  Negative for chest pain.  Gastrointestinal:  Positive for abdominal pain, constipation and nausea. Negative for blood in stool, diarrhea, melena and vomiting.      Objective:     BP 104/72 (BP Location: Left Arm, Patient Position: Sitting, Cuff Size: Normal)   Pulse 75   Temp 97.6 F (36.4 C) (Oral)   Wt 124 lb 14.4 oz (56.7 kg)   LMP 10/06/2013   SpO2 99%   BMI 18.44 kg/m  BP Readings  from Last 3 Encounters:  03/17/23 104/72  09/18/22 110/60  04/28/22 110/70   Wt Readings from Last 3 Encounters:  03/17/23 124 lb 14.4 oz (56.7 kg)  09/18/22 125 lb 4.8 oz (56.8 kg)  04/28/22 121 lb 6 oz (55.1 kg)      Physical Exam Vitals reviewed.  Constitutional:      General: She is not in acute distress.    Appearance: She is not ill-appearing.  Eyes:     Extraocular Movements: Extraocular movements intact.     Pupils: Pupils are equal, round, and reactive to light.  Cardiovascular:     Rate and Rhythm: Normal rate and regular rhythm.   Pulmonary:     Effort: Pulmonary effort is normal.  Abdominal:     General: Bowel sounds are normal. There is no distension.     Palpations: Abdomen is soft. There is no mass.     Tenderness: There is no abdominal tenderness. There is no guarding or rebound.  Musculoskeletal:     Cervical back: Neck supple.  Lymphadenopathy:     Cervical: No cervical adenopathy.  Neurological:     General: No focal deficit present.     Mental Status: She is alert.     Cranial Nerves: No cranial nerve deficit.      No results found for any visits on 03/17/23.    The 10-year ASCVD risk score (Arnett DK, et al., 2019) is: 1.2%    Assessment & Plan:   #1 history of GERD.  She has had multiple recent GI complaints including frequent belching, nausea without vomiting, substernal burning in spite of Aciphex.  She reportedly had a EGD 2023. -Patient well aware of dietary factors with regard to GERD -We discussed possible change from Aciphex to Protonix 40 mg once daily -Continue lifestyle factors and managing GERD and consider elevate head of bed 4 to 5 inches -She has scheduled follow-up on Monday and reassess then.  If not improving at that point consider referral back to GI.  #2 intermittent constipation.  Recommend repeat thyroid studies with her history of known hypothyroidism.  Avoid regular use of stimulant laxatives.  She has had EGD and colonoscopy as above.  She does appear to have very chronic constipation and may benefit from motility agent if covered by insurance  #3 intermittent bilateral peripheral blurred vision.  Symptoms sound highly suggestive of ocular migraine.  These episodes have consistently cleared within about 30 minutes.  Reassurance.  Follow-up for any diplopia or other new changes   No follow-ups on file.    Evelena Peat, MD

## 2023-03-22 ENCOUNTER — Ambulatory Visit (INDEPENDENT_AMBULATORY_CARE_PROVIDER_SITE_OTHER): Admitting: Family Medicine

## 2023-03-22 ENCOUNTER — Encounter: Payer: Self-pay | Admitting: Family Medicine

## 2023-03-22 VITALS — BP 110/70 | HR 66 | Temp 97.4°F | Ht 68.11 in | Wt 125.6 lb

## 2023-03-22 DIAGNOSIS — Z0000 Encounter for general adult medical examination without abnormal findings: Secondary | ICD-10-CM

## 2023-03-22 DIAGNOSIS — R1084 Generalized abdominal pain: Secondary | ICD-10-CM

## 2023-03-22 DIAGNOSIS — E039 Hypothyroidism, unspecified: Secondary | ICD-10-CM

## 2023-03-22 DIAGNOSIS — Z111 Encounter for screening for respiratory tuberculosis: Secondary | ICD-10-CM

## 2023-03-22 NOTE — Progress Notes (Signed)
 Established Patient Office Visit  Subjective   Patient ID: Nancy Choi, female    DOB: August 09, 1965  Age: 58 y.o. MRN: [id number]  Chief Complaint  Patient presents with   Annual Exam    HPI   Nancy Choi is seen for physical exam.  She has form that needs to be completed for [facility].  She is planning to help with their data management.  Past medical history reviewed.  She has history of GERD, hypothyroidism, recurrent depression.  Has recently had some increase in GI symptoms.  We plan to get some follow-up labs today.  She has been very health-conscious.  She eats very healthy overall and exercises regularly with a variety of exercises including aerobic and resistance training.  She sees GYN regularly and has gotten Pap smears and DEXA scans through them.  Her tetanus is up-to-date.  She has already had prior Shingrix vaccine but we did not have dates.  Colonoscopy due 8/25.  She gets annual mammograms.  Social History   Socioeconomic History   Marital status: Single    Spouse name: Not on file   Number of children: Not on file   Years of education: Not on file   Highest education level: Not on file  Occupational History   Not on file  Tobacco Use   Smoking status: Never   Smokeless tobacco: Never  Vaping Use   Vaping status: Never Used  Substance and Sexual Activity   Alcohol use: No    Alcohol/week: 0.0 standard drinks of alcohol   Drug use: No   Sexual activity: Not on file  Other Topics Concern   Not on file  Social History Narrative   Not on file   Social Drivers of Health   Financial Resource Strain: Low Risk  (01/13/2022)   Received from [facility], [facility]   Overall Financial Resource Strain (CARDIA)    Difficulty of Paying Living Expenses: Not hard at all  Food Insecurity: Unknown (01/13/2022)   Received from [facility]   Hunger Vital Sign    Worried About Running Out of Food in the Last Year: Not on file    Ran Out of Food in  the Last Year: Never true  Transportation Needs: No Transportation Needs (01/13/2022)   Received from [employer], [facility]   PRAPARE - Transportation    Lack of Transportation (Medical): No    Lack of Transportation (Non-Medical): No  Physical Activity: Sufficiently Active (01/13/2022)   Received from [facility], [facility]   Exercise Vital Sign    Days of Exercise per Week: 4 days    Minutes of Exercise per Session: 60 min  Stress: No Stress Concern Present (01/13/2022)   Received from [facility], [facility] - Occupational Stress Questionnaire    Feeling of Stress : Only a little  Social Connections: Socially Integrated (01/13/2022)   Received from [facility], [facility]   Social Network    How would you rate your social network (family, work, friends)?: Good participation with social networks  Intimate Partner Violence: Not At Risk (01/13/2022)   Received from [facility], [facility]   HITS    Over the last 12 months how often did your partner physically hurt you?: Never    Over the last 12 months how often did your partner insult you or talk down to you?: Never    Over the last 12 months how often did your partner  threaten you with physical harm?: Never    Over the last 12 months how often did your partner scream or curse at you?: Never   Family History  Problem Relation Age of Onset   Breast cancer Mother 69       metastic (-) BrCA gene. blood work shows ATM mutation in [place] (2022)   Depression Mother    Hyperlipidemia Father    Hypertension Father    Asthma Maternal Grandmother    Diabetes Paternal Grandmother    Breast cancer Cousin 31   Stomach cancer Neg Hx    Rectal cancer Neg Hx    Esophageal cancer Neg Hx    Colon polyps Neg Hx    Colon cancer Neg Hx     Review of Systems  Constitutional:  Negative for malaise/fatigue.  Eyes:  Negative for blurred vision.  Respiratory:  Negative for shortness of  breath.   Cardiovascular:  Negative for chest pain.  Genitourinary:  Negative for dysuria.  Neurological:  Negative for dizziness, weakness and headaches.      Objective:     BP 110/70 (BP Location: Left Arm, Patient Position: Sitting, Cuff Size: Normal)   Pulse 66   Temp (!) 97.4 F (36.3 C) (Oral)   Ht 5' 8.11" (1.73 m)   Wt 125 lb 9.6 oz (57 kg)   LMP 10/06/2013   SpO2 99%   BMI 19.04 kg/m  BP Readings from Last 3 Encounters:  03/22/23 110/70  03/17/23 104/72  09/18/22 110/60   Wt Readings from Last 3 Encounters:  03/22/23 125 lb 9.6 oz (57 kg)  03/17/23 124 lb 14.4 oz (56.7 kg)  09/18/22 125 lb 4.8 oz (56.8 kg)      Physical Exam Vitals reviewed.  Constitutional:      Appearance: She is well-developed.  HENT:     Right Ear: Tympanic membrane normal.     Left Ear: Tympanic membrane normal.  Eyes:     Pupils: Pupils are equal, round, and reactive to light.  Neck:     Thyroid: No thyromegaly.     Vascular: No JVD.  Cardiovascular:     Rate and Rhythm: Normal rate and regular rhythm.     Heart sounds:     No gallop.  Pulmonary:     Effort: Pulmonary effort is normal. No respiratory distress.     Breath sounds: Normal breath sounds. No wheezing or rales.  Musculoskeletal:     Cervical back: Neck supple.     Right lower leg: No edema.     Left lower leg: No edema.  Neurological:     Mental Status: She is alert.      No results found for any visits on 03/22/23.    The 10-year ASCVD risk score (Arnett DK, et al., 2019) is: 1.3%    Assessment & Plan:   Physical exam.  She has chronic problems as above.  We discussed the following health maintenance items  -Consider annual flu vaccine -She has already completed Shingrix -Continue GYN follow-up for Pap smear and mammograms.  She also states she has had previous DEXA scan and has known osteopenia.  She is also taking vitamin D and calcium regularly -Continue regular aerobic and weightbearing exercise  with resistance training -Obtain follow-up labs including thyroid function, CBC, CMP.  Also adding QuantiFERON gold plus assay as she is requiring tuberculosis screening prior to her new.job -Due for repeat colonoscopy 8/25   No follow-ups on file.    Evelena Peat, MD

## 2023-03-23 LAB — COMPREHENSIVE METABOLIC PANEL
ALT: 11 U/L (ref 0–35)
AST: 17 U/L (ref 0–37)
Albumin: 4.3 g/dL (ref 3.5–5.2)
Alkaline Phosphatase: 84 U/L (ref 39–117)
BUN: 15 mg/dL (ref 6–23)
CO2: 29 meq/L (ref 19–32)
Calcium: 9.2 mg/dL (ref 8.4–10.5)
Chloride: 103 meq/L (ref 96–112)
Creatinine, Ser: 0.68 mg/dL (ref 0.40–1.20)
GFR: 96.1 mL/min (ref >60.00)
Glucose, Bld: 87 mg/dL (ref 70–99)
Potassium: 3.7 meq/L (ref 3.5–5.1)
Sodium: 139 meq/L (ref 135–145)
Total Bilirubin: 0.4 mg/dL (ref 0.2–1.2)
Total Protein: 7.1 g/dL (ref 6.0–8.3)

## 2023-03-23 LAB — TSH: TSH: 0.02 u[IU]/mL — ABNORMAL LOW (ref 0.35–5.50)

## 2023-03-23 LAB — CBC WITH DIFFERENTIAL/PLATELET
Basophils Absolute: 0 10*3/uL (ref 0.0–0.1)
Basophils Relative: 0.3 % (ref 0.0–3.0)
Eosinophils Absolute: 0.1 10*3/uL (ref 0.0–0.7)
Eosinophils Relative: 1.4 % (ref 0.0–5.0)
HCT: 41.8 % (ref 36.0–46.0)
Hemoglobin: 13.8 g/dL (ref 12.0–15.0)
Lymphocytes Relative: 39.4 % (ref 12.0–46.0)
Lymphs Abs: 2.7 10*3/uL (ref 0.7–4.0)
MCHC: 33.1 g/dL (ref 30.0–36.0)
MCV: 92.2 fl (ref 78.0–100.0)
Monocytes Absolute: 0.6 10*3/uL (ref 0.1–1.0)
Monocytes Relative: 8.4 % (ref 3.0–12.0)
Neutro Abs: 3.4 10*3/uL (ref 1.4–7.7)
Neutrophils Relative %: 50.5 % (ref 43.0–77.0)
Platelets: 229 10*3/uL (ref 150.0–400.0)
RBC: 4.54 Mil/uL (ref 3.87–5.11)
RDW: 13.2 % (ref 11.5–15.5)
WBC: 6.8 10*3/uL (ref 4.0–10.5)

## 2023-03-23 LAB — T4, FREE: Free T4: 0.78 ng/dL (ref 0.60–1.60)

## 2023-03-24 ENCOUNTER — Other Ambulatory Visit: Payer: Self-pay | Admitting: Family Medicine

## 2023-03-24 LAB — QUANTIFERON-TB GOLD PLUS
Mitogen-NIL: >10 [IU]/mL
NIL: 0.03 [IU]/mL
QuantiFERON-TB Gold Plus: NEGATIVE
TB1-NIL: 0.05 [IU]/mL
TB2-NIL: 0.02 [IU]/mL

## 2023-04-02 ENCOUNTER — Telehealth: Admitting: Physician Assistant

## 2023-04-02 DIAGNOSIS — H01004 Unspecified blepharitis left upper eyelid: Secondary | ICD-10-CM | POA: Diagnosis not present

## 2023-04-02 MED ORDER — ERYTHROMYCIN 5 MG/GM OP OINT: 1.0000 | TOPICAL_OINTMENT | Freq: Every day | OPHTHALMIC | 0 refills | Status: AC

## 2023-04-02 NOTE — Progress Notes (Signed)
 Virtual Visit Consent   Nancy Choi, you are scheduled for a virtual visit with a [facility] provider today. Just as with appointments in the office, your consent must be obtained to participate. Your consent will be active for this visit and any virtual visit you may have with one of our providers in the next 365 days. If you have a MyChart account, a copy of this consent can be sent to you electronically.  As this is a virtual visit, video technology does not allow for your provider to perform a traditional examination. This may limit your provider's ability to fully assess your condition. If your provider identifies any concerns that need to be evaluated in person or the need to arrange testing (such as labs, EKG, etc.), we will make arrangements to do so. Although advances in technology are sophisticated, we cannot ensure that it will always work on either your end or our end. If the connection with a video visit is poor, the visit may have to be switched to a telephone visit. With either a video or telephone visit, we are not always able to ensure that we have a secure connection.  By engaging in this virtual visit, you consent to the provision of healthcare and authorize for your insurance to be billed (if applicable) for the services provided during this visit. Depending on your insurance coverage, you may receive a charge related to this service.  I need to obtain your verbal consent now. Are you willing to proceed with your visit today? MAHATHI POKORNEY has provided verbal consent on 04/02/2023 for a virtual visit (video or telephone). Margaretann Loveless, PA-C  Date: 04/02/2023 8:25 AM   Virtual Visit via Video Note   I, Margaretann Loveless, connected with  Nancy Choi  ([id number], 1965-10-14) on 04/02/23 at  8:15 AM EDT by a video-enabled telemedicine application and verified that I am speaking with the correct person using two identifiers.  Location: Patient: Virtual Visit  Location Patient: Home Provider: Virtual Visit Location Provider: Home Office   I discussed the limitations of evaluation and management by telemedicine and the availability of in person appointments. The patient expressed understanding and agreed to proceed.    History of Present Illness: Nancy Choi is a 58 y.o. who identifies as a female who was assigned female at birth, and is being seen today for swollen eyelid.  HPI: Eye Problem  The left eye is affected. This is a new (did have something similar 02828/24) problem. The current episode started yesterday. The problem occurs constantly. The problem has been gradually worsening. There was no injury mechanism. The pain is at a severity of 0/10. The patient is experiencing no pain. There is No known exposure to pink eye. She Does not wear contacts. Associated symptoms include eye redness (of left upper eyelide with swelling). Pertinent negatives include no blurred vision, eye discharge, double vision, fever, foreign body sensation, itching, nausea or photophobia. She has tried nothing for the symptoms. The treatment provided no relief.     Problems:  Patient Active Problem List   Diagnosis Date Noted   Dietary counseling and surveillance 01/27/2022   Gastrointestinal complaints 01/27/2022   GERD (gastroesophageal reflux disease) 01/13/2022   Facial pain 06/16/2013   Hypothyroidism 08/14/2008   GENERALIZED ANXIETY DISORDER 08/14/2008   Depression, recurrent (HCC) 08/14/2008    Allergies:  Allergies  Allergen Reactions   Flagyl [Metronidazole] Nausea Only   Codeine Sulfate     REACTION: irritable, bugs  crawling on skin sensation   Other     Covid Vaccine    Doxycycline Nausea Only   Medications:  Current Outpatient Medications:    erythromycin ophthalmic ointment, Place 1 Application into the left eye at bedtime., Disp: 7 g, Rfl: 0   ARMOUR THYROID 60 MG tablet, Takes 60mg  in the morning and 60 mg at night., Disp: , Rfl:     Ascorbic Acid (VITAMIN C) 1000 MG tablet, Take 1,000 mg by mouth daily., Disp: , Rfl:    buPROPion (WELLBUTRIN XL) 150 MG 24 hr tablet, Take 1 tablet (150 mg total) by mouth daily., Disp: 90 tablet, Rfl: 1   Cholecalciferol (VITAMIN D) 2000 UNITS CAPS, Take by mouth 3 (three) times a week. , Disp: , Rfl:    cyanocobalamin 100 MCG tablet, Take by mouth., Disp: , Rfl:    estradiol (ESTRACE) 1 MG tablet, TAKE ONE AND A HALF TABLET BY MOUTH EVERY DAY, Disp: , Rfl:    INTRAROSA 6.5 MG INST, INSERT 1 APPLICATORFUL INTRAVAGINALLY AT BEDTIME NIGHTLY., Disp: , Rfl:    pantoprazole (PROTONIX) 40 MG tablet, Take 1 tablet (40 mg total) by mouth daily., Disp: 30 tablet, Rfl: 2   progesterone (PROMETRIUM) 100 MG capsule, Take 150 mg by mouth daily. , Disp: , Rfl:    terbinafine (LAMISIL) 250 MG tablet, Take 1 tablet (250 mg total) by mouth daily., Disp: 90 tablet, Rfl: 0   triamcinolone cream (KENALOG) 0.1 %, Apply to affected area twice a day, Disp: , Rfl:    venlafaxine (EFFEXOR) 25 MG tablet, Take 0.5 tablets (12.5 mg total) by mouth daily. Compound to total dose of 12.5mg  from capsule or tablet as per past history of taking it, Disp: 45 tablet, Rfl: 1   vitamin E 100 UNIT capsule, Take 100 Units by mouth daily., Disp: , Rfl:   Observations/Objective: Patient is well-developed, well-nourished in no acute distress.  Resting comfortably at home.  Head is normocephalic, atraumatic.  No labored breathing.  Speech is clear and coherent with logical content.  Patient is alert and oriented at baseline.  Left upper eyelid is swollen and red  Assessment and Plan: 1. Blepharitis of left upper eyelid, unspecified type (Primary) - erythromycin ophthalmic ointment; Place 1 Application into the left eye at bedtime.  Dispense: 7 g; Refill: 0  - Suspect Blepharitis of the left upper eyelid, possibly from chronic dry eye - Erythromycin ointment prescribed - Warm compresses - Good hand hygiene - Discussed washing  eyelids with a gentle shampoo, like J&J Baby Shampoo once weekly to prevent recurrence - Seek in person evaluation if symptoms worsen or fail to improve   Follow Up Instructions: I discussed the assessment and treatment plan with the patient. The patient was provided an opportunity to ask questions and all were answered. The patient agreed with the plan and demonstrated an understanding of the instructions.  A copy of instructions were sent to the patient via MyChart unless otherwise noted below.    The patient was advised to call back or seek an in-person evaluation if the symptoms worsen or if the condition fails to improve as anticipated.    Margaretann Loveless, PA-C

## 2023-04-02 NOTE — Patient Instructions (Signed)
 Nancy Choi, thank you for joining Margaretann Loveless, PA-C for today's virtual visit.  While this provider is not your primary care provider (PCP), if your PCP is located in our provider database this encounter information will be shared with them immediately following your visit.   A [facility] MyChart account gives you access to today's visit and all your visits, tests, and labs performed at [facility] " click here if you don't have a [facility] MyChart account or go to [email or web address]  Consent: (Patient) Nancy Choi provided verbal consent for this virtual visit at the beginning of the encounter.  Current Medications:  Current Outpatient Medications:    erythromycin ophthalmic ointment, Place 1 Application into the left eye at bedtime., Disp: 7 g, Rfl: 0   ARMOUR THYROID 60 MG tablet, Takes 60mg  in the morning and 60 mg at night., Disp: , Rfl:    Ascorbic Acid (VITAMIN C) 1000 MG tablet, Take 1,000 mg by mouth daily., Disp: , Rfl:    buPROPion (WELLBUTRIN XL) 150 MG 24 hr tablet, Take 1 tablet (150 mg total) by mouth daily., Disp: 90 tablet, Rfl: 1   Cholecalciferol (VITAMIN D) 2000 UNITS CAPS, Take by mouth 3 (three) times a week. , Disp: , Rfl:    cyanocobalamin 100 MCG tablet, Take by mouth., Disp: , Rfl:    estradiol (ESTRACE) 1 MG tablet, TAKE ONE AND A HALF TABLET BY MOUTH EVERY DAY, Disp: , Rfl:    INTRAROSA 6.5 MG INST, INSERT 1 APPLICATORFUL INTRAVAGINALLY AT BEDTIME NIGHTLY., Disp: , Rfl:    pantoprazole (PROTONIX) 40 MG tablet, Take 1 tablet (40 mg total) by mouth daily., Disp: 30 tablet, Rfl: 2   progesterone (PROMETRIUM) 100 MG capsule, Take 150 mg by mouth daily. , Disp: , Rfl:    terbinafine (LAMISIL) 250 MG tablet, Take 1 tablet (250 mg total) by mouth daily., Disp: 90 tablet, Rfl: 0   triamcinolone cream (KENALOG) 0.1 %, Apply to affected area twice a day, Disp: , Rfl:    venlafaxine (EFFEXOR) 25 MG tablet, Take 0.5 tablets (12.5 mg  total) by mouth daily. Compound to total dose of 12.5mg  from capsule or tablet as per past history of taking it, Disp: 45 tablet, Rfl: 1   vitamin E 100 UNIT capsule, Take 100 Units by mouth daily., Disp: , Rfl:    Medications ordered in this encounter:  Meds ordered this encounter  Medications   erythromycin ophthalmic ointment    Sig: Place 1 Application into the left eye at bedtime.    Dispense:  7 g    Refill:  0    Supervising Provider:   Merrilee Jansky [[id number]]     *If you need refills on other medications prior to your next appointment, please contact your pharmacy*  Follow-Up: Call back or seek an in-person evaluation if the symptoms worsen or if the condition fails to improve as anticipated.  [facility] [phone]  Other Instructions  Blepharitis Blepharitis refers to inflammation of the eyelids. It is a common condition and can cause dryness or grittiness in the eyes. Other symptoms may include: Reddish, scaly skin around the scalp and eyebrows. Burning or itching of the eyelids. Eye discharge at night that causes the eyelashes to stick together in the morning. Eyelashes that fall out. Redness of the eyes. Sensitivity to light. Follow these instructions at home: Pay attention to any changes in how your eyes look or feel. Tell your health care provider about  any changes. Follow these instructions to help with your condition. Keeping clean Wash your hands often with soap and water for at least 20 seconds. Clean your eyes and wash the edges of your eyelids with diluted baby shampoo or commercial eyelid wipes. Do this 2 or more times a day. Wash your face and eyebrows at least once a day. Use a clean towel each time you dry your eyelids. Do not use this towel to clean or dry other areas of your body. Do not share your towel with anyone. General instructions Avoid wearing makeup until you get better. Do not share makeup with anyone. Avoid rubbing your  eyes. Use warm compresses on the eyes for 5-10 minutes. Do this 1 or 2 times a day, or as told by your health care provider. You can use warm water on a towel, but a microwaveable heating pad often stays warm longer. The pad should be very warm but not hot enough to burn the skin. If you were prescribed an antibiotic ointment or steroid drops, apply or use the medicine as told by your health care provider. Do not stop using the medicine even if you feel better. Keep all follow-up visits. This is important. Contact a health care provider if: Your eyelids feel hot. You have blisters or a rash on your eyelids. The inflammation gets worse or does not go away in 2-4 days. Get help right away if: You have pain or redness that gets worse or spreads to other parts of your face. Your vision changes. You have pain when looking at lights or moving objects. You have a fever. Summary Blepharitis refers to inflammation of the eyelids. It can cause dryness and grittiness in the eyes. Pay attention to any changes in how your eyes look or feel. Tell your health care provider about any changes. Follow home care instructions as told by your health care provider. Wash your hands often with soap and water for at least 20 seconds. Avoid wearing makeup. Do not rub your eyes. If you were prescribed an antibiotic ointment or steroid drops, apply or use the medicine as told by your health care provider. Get help right away if you have a fever, vision changes, pain or redness that gets worse or spreads to other parts of your face, or pain when looking at lights or moving objects. This information is not intended to replace advice given to you by your health care provider. Make sure you discuss any questions you have with your health care provider. Document Revised: 01/31/2020 Document Reviewed: 01/31/2020 Elsevier Patient Education  2024 Elsevier Inc.   If you have been instructed to have an in-person evaluation today  at a local Urgent Care facility, please use the link below. It will take you to a list of all of our available [facility], including address, phone number and hours of operation. Please do not delay care.  [facility]  If you or a family member do not have a primary care provider, use the link below to schedule a visit and establish care. When you choose a [facility] primary care physician or advanced practice provider, you gain a long-term partner in health. Find a Primary Care Provider  Learn more about [facility]'s in-office and virtual care options: [facility] - Get Care Now

## 2023-04-03 ENCOUNTER — Encounter: Payer: Self-pay | Admitting: Physician Assistant

## 2023-04-07 ENCOUNTER — Encounter: Payer: Self-pay | Admitting: Family Medicine

## 2023-05-11 ENCOUNTER — Other Ambulatory Visit: Payer: Self-pay | Admitting: Family Medicine

## 2023-05-11 NOTE — Telephone Encounter (Signed)
 Pt called inquiring about her refill. Let agent know that the request just came in today and that it can take up to 3 business days for it to be filled.   Agent relayed the message to the pt.   FYI.

## 2023-05-11 NOTE — Telephone Encounter (Signed)
 Copied from CRM [id number]. Topic: Clinical - Medication Refill >> May 11, 2023 10:16 AM Elita Guitar wrote: Most Recent Primary Care Visit:  Provider: Marquetta Sit  Department: [facility]  Visit Type: PHYSICAL  Date: 03/22/2023  Medication: Venlafaxine   Has the patient contacted their pharmacy? Yes   Is this the correct pharmacy for this prescription? Yes If no, delete pharmacy and type the correct one.  This is the patient's preferred pharmacy:  [facility] [place], [place] - [address] [place] [address] [place] [address] Phone: [phone] Fax: [phone]   Has the prescription been filled recently? Yes  Is the patient out of the medication? Yes  Has the patient been seen for an appointment in the last year OR does the patient have an upcoming appointment? Yes  Can we respond through MyChart? Yes  Agent: Please be advised that Rx refills may take up to 3 business days. We ask that you follow-up with your pharmacy.

## 2023-05-12 MED ORDER — VENLAFAXINE HCL 25 MG PO TABS: 12.5000 mg | ORAL_TABLET | Freq: Every day | ORAL | 1 refills | Status: DC

## 2023-06-08 ENCOUNTER — Encounter: Payer: Self-pay | Admitting: Family Medicine

## 2023-06-08 DIAGNOSIS — R1084 Generalized abdominal pain: Secondary | ICD-10-CM

## 2023-06-10 ENCOUNTER — Telehealth: Payer: Self-pay | Admitting: *Deleted

## 2023-06-10 NOTE — Telephone Encounter (Signed)
 Copied from CRM [id number]. Topic: General - Other >> Jun 10, 2023  9:41 AM Clydene Darner H wrote: Reason for CRM: Edwina Gram from [facility] called regarding the GI referral to Dr. Tova Fresh. She wanted to inform Dr. Darren Em that they are unable to see the patient, as the patient was recently seen by Dr. Neldon Baltimore on 04/16/2023

## 2023-06-11 NOTE — Telephone Encounter (Signed)
 Patient informed of the message below and reported she would call back to follow up with new referral

## 2023-06-11 NOTE — Telephone Encounter (Signed)
 Agent said pt did not understood the message and would like a callback

## 2023-06-11 NOTE — Telephone Encounter (Signed)
Left detailed message informing patient of message below.

## 2023-06-22 ENCOUNTER — Encounter: Payer: Self-pay | Admitting: Family Medicine

## 2023-06-22 DIAGNOSIS — R1084 Generalized abdominal pain: Secondary | ICD-10-CM

## 2023-07-02 ENCOUNTER — Encounter: Payer: Self-pay | Admitting: Family Medicine

## 2023-07-02 ENCOUNTER — Ambulatory Visit: Admitting: Family Medicine

## 2023-07-02 VITALS — BP 122/70 | HR 82 | Temp 98.1°F | Wt 120.7 lb

## 2023-07-02 DIAGNOSIS — R1011 Right upper quadrant pain: Secondary | ICD-10-CM | POA: Diagnosis not present

## 2023-07-02 DIAGNOSIS — K219 Gastro-esophageal reflux disease without esophagitis: Secondary | ICD-10-CM | POA: Diagnosis not present

## 2023-07-02 NOTE — Progress Notes (Signed)
 Established Patient Office Visit  Subjective   Patient ID: Nancy Choi, female    DOB: 07/29/1965  Age: 57 y.o. MRN: [id number]  Chief Complaint  Patient presents with   Labs Only    HPI   Nancy Choi is seen with some ongoing dyspepsia and upper esophageal burning sensation and now more recently right upper quadrant abdominal pain.  She is currently established with gastroenterologist and is looking at getting another opinion.  She relates she had EGD with [facility] back in October 23.  At that time no evidence for Barrett's esophagus or H. pylori.  She initially responded well to PPI with Nexium but then quit responding and also has not seen much improvement with Aciphex and is currently on pantoprazole .  Initially saw some improvement but not as much now.  No improvement with Pepcid.  She at one point had some initial improvement with Carafate but that also seemed to lose its effect.  She had called recently with concerns about possible gluten sensitivity.  No known family history.  She has noted that cereals and especially whole-wheat bread seem to irritate her symptoms.  She also had developed some recent right upper quadrant pain.  She had imaging with ultrasound and MRI back in October 23.  No history of gallstones.  She has had repeat limited right upper quadrant ultrasound ordered per GI and is currently undecided whether she will to follow through without.  Avoids nonsteroidals.  No alcohol.  No recent melena.  No hematemesis.  Weight down a couple pounds recently.  Past Medical History:  Diagnosis Date   Allergy    DEPRESSION 08/14/2008   Qualifier: Diagnosis of  By: Ivy Marseilles LPN, Nancy     Eczema    Generalized anxiety disorder 08/14/2008   Qualifier: Diagnosis of  By: Ivy Marseilles LPN, Haskell Linker     HYPOTHYROIDISM 08/14/2008   Qualifier: Diagnosis of  By: Ivy Marseilles LPN, Haskell Linker     Past Surgical History:  Procedure Laterality Date   BREAST LUMPECTOMY Right    1985 benign    BREAST SURGERY      reports that she has never smoked. She has never used smokeless tobacco. She reports that she does not drink alcohol and does not use drugs. family history includes Asthma in her maternal grandmother; Breast cancer (age of onset: 33) in her cousin; Breast cancer (age of onset: 80) in her mother; Depression in her mother; Diabetes in her paternal grandmother; Hyperlipidemia in her father; Hypertension in her father. Allergies  Allergen Reactions   Flagyl [Metronidazole] Nausea Only   Codeine Sulfate     REACTION: irritable, bugs crawling on skin sensation   Other     Covid Vaccine    Doxycycline Nausea Only    Review of Systems  Constitutional:  Negative for chills and fever.  Respiratory:  Negative for shortness of breath.   Cardiovascular:  Negative for chest pain.  Gastrointestinal:  Positive for abdominal pain. Negative for blood in stool, melena and vomiting.      Objective:     BP 122/70 (BP Location: Left Arm, Patient Position: Sitting, Cuff Size: Normal)   Pulse 82   Temp 98.1 F (36.7 C) (Oral)   Wt 120 lb 11.2 oz (54.7 kg)   LMP 10/06/2013   SpO2 99%   BMI 18.29 kg/m  BP Readings from Last 3 Encounters:  07/02/23 122/70  03/22/23 110/70  03/17/23 104/72   Wt Readings from Last 3 Encounters:  07/02/23 120 lb 11.2  oz (54.7 kg)  03/22/23 125 lb 9.6 oz (57 kg)  03/17/23 124 lb 14.4 oz (56.7 kg)      Physical Exam Vitals reviewed.  Constitutional:      General: She is not in acute distress.    Appearance: She is not ill-appearing.   Cardiovascular:     Rate and Rhythm: Normal rate and regular rhythm.  Pulmonary:     Effort: Pulmonary effort is normal.  Abdominal:     General: Abdomen is flat. There is no distension.     Palpations: There is no mass.     Tenderness: There is no guarding or rebound.     Comments: Mild tenderness right upper quadrant to deep palpation   Neurological:     Mental Status: She is alert.      No  results found for any visits on 07/02/23.  Last CBC Lab Results  Component Value Date   WBC 6.8 03/22/2023   HGB 13.8 03/22/2023   HCT 41.8 03/22/2023   MCV 92.2 03/22/2023   RDW 13.2 03/22/2023   PLT 229.0 03/22/2023   Last metabolic panel Lab Results  Component Value Date   GLUCOSE 87 03/22/2023   NA 139 03/22/2023   K 3.7 03/22/2023   CL 103 03/22/2023   CO2 29 03/22/2023   BUN 15 03/22/2023   CREATININE 0.68 03/22/2023   GFR 96.10 03/22/2023   CALCIUM 9.2 03/22/2023   PROT 7.1 03/22/2023   ALBUMIN 4.3 03/22/2023   BILITOT 0.4 03/22/2023   ALKPHOS 84 03/22/2023   AST 17 03/22/2023   ALT 11 03/22/2023      The ASCVD Risk score (Arnett DK, et al., 2019) failed to calculate for the following reasons:   Cannot find a previous HDL lab   Cannot find a previous total cholesterol lab    Assessment & Plan:   Abdominal complaints.  He has had frequent substernal burning sensation not improved recently with PPI.  Previous EGD as above 10/23.  Recently developed some right upper quadrant abdominal pain.  She is specifically concerned about possible gluten sensitivity.  Currently followed by gastroenterology and she is looking at getting a second opinion.  We discussed the following  -Recommend she follow through with ultrasound which has already been ordered by her current GI provider - Continue Protonix  for now - Agreed to check tissue transglutaminase and IgA - Previous H. pylori testing negative.   No follow-ups on file.    Glean Lamy, MD

## 2023-07-03 LAB — TISSUE TRANSGLUTAMINASE, IGA: (tTG) Ab, IgA: <1 U/mL

## 2023-07-03 LAB — IGA: Immunoglobulin A: 135 mg/dL (ref 47–310)

## 2023-07-04 ENCOUNTER — Ambulatory Visit: Payer: Self-pay | Admitting: Family Medicine

## 2023-07-13 ENCOUNTER — Other Ambulatory Visit: Payer: Self-pay | Admitting: Obstetrics and Gynecology

## 2023-07-13 DIAGNOSIS — Z9189 Other specified personal risk factors, not elsewhere classified: Secondary | ICD-10-CM

## 2023-08-05 ENCOUNTER — Other Ambulatory Visit: Payer: Self-pay | Admitting: Family Medicine

## 2023-08-05 MED ORDER — VENLAFAXINE HCL 25 MG PO TABS: 12.5000 mg | ORAL_TABLET | Freq: Every day | ORAL | 1 refills | Status: DC

## 2023-08-05 NOTE — Telephone Encounter (Signed)
 Copied from CRM [id number]. Topic: Clinical - Medication Refill >> Aug 05, 2023 10:41 AM Macario HERO wrote: Medication: venlafaxine  (EFFEXOR ) 25 MG tablet [[id number]]  Has the patient contacted their pharmacy? Yes (Agent: If no, request that the patient contact the pharmacy for the refill. If patient does not wish to contact the pharmacy document the reason why and proceed with request.) (Agent: If yes, when and what did the pharmacy advise?) Said they sent in a prescription   This is the patient's preferred pharmacy:  [facility] [place], [place] - [address] [place] [address] [place] [address] Phone: [phone] Fax: [phone]   Is this the correct pharmacy for this prescription? Yes If no, delete pharmacy and type the correct one.   Has the prescription been filled recently? Yes  Is the patient out of the medication? Yes  Has the patient been seen for an appointment in the last year OR does the patient have an upcoming appointment? Yes  Can we respond through MyChart? Yes  Agent: Please be advised that Rx refills may take up to 3 business days. We ask that you follow-up with your pharmacy.

## 2023-08-05 NOTE — Telephone Encounter (Signed)
 Last OV 07/02/23

## 2023-09-23 ENCOUNTER — Other Ambulatory Visit: Payer: Self-pay | Admitting: Family Medicine

## 2023-11-04 ENCOUNTER — Telehealth: Payer: Self-pay

## 2023-11-04 MED ORDER — VENLAFAXINE HCL 25 MG PO TABS: 12.5000 mg | ORAL_TABLET | Freq: Every day | ORAL | 1 refills | Status: DC

## 2023-11-04 NOTE — Addendum Note (Signed)
 Addended by: METTA KRISTEN CROME on: 11/04/2023 11:28 AM   Modules accepted: Orders

## 2023-11-04 NOTE — Telephone Encounter (Signed)
 Copied from CRM [id number]. Topic: Clinical - Prescription Issue >> Nov 04, 2023 11:06 AM Rea ORN wrote: Reason for CRM: pt calling to see why rx is being denied when the pharmacy requests it. venlafaxine  (EFFEXOR ) 25 MG tablet was requested on Monday by [facility] and clinic denied. Pt has been on the rx for almost 25 years and is inquire why it is being denied. Please call back to advise.

## 2023-11-04 NOTE — Telephone Encounter (Signed)
 Rx done.

## 2023-11-05 ENCOUNTER — Encounter: Payer: Self-pay | Admitting: Family Medicine

## 2023-11-10 ENCOUNTER — Encounter: Payer: Self-pay | Admitting: Family Medicine

## 2023-11-11 MED ORDER — RABEPRAZOLE SODIUM 20 MG PO TBEC: 20.0000 mg | DELAYED_RELEASE_TABLET | Freq: Every day | ORAL | 3 refills | Status: AC

## 2024-01-15 ENCOUNTER — Other Ambulatory Visit

## 2024-01-15 DIAGNOSIS — Z9189 Other specified personal risk factors, not elsewhere classified: Secondary | ICD-10-CM

## 2024-01-15 MED ORDER — GADOPICLENOL 0.5 MMOL/ML IV SOLN
5.0000 mL | Freq: Once | INTRAVENOUS | Status: AC | PRN
  Administered 2024-01-15: 5 mL via INTRAVENOUS

## 2024-01-15 MED ORDER — GADOPICLENOL 0.5 MMOL/ML IV SOLN: 5.0000 mL | Freq: Once | INTRAVENOUS | Status: DC | PRN

## 2024-02-01 ENCOUNTER — Encounter: Payer: Self-pay | Admitting: Family Medicine

## 2024-02-02 MED ORDER — VENLAFAXINE HCL 25 MG PO TABS: 12.5000 mg | ORAL_TABLET | Freq: Every day | ORAL | 1 refills | Status: AC

## 2024-02-16 ENCOUNTER — Encounter: Payer: Self-pay | Admitting: Family Medicine

## 2024-02-16 DIAGNOSIS — Z0100 Encounter for examination of eyes and vision without abnormal findings: Secondary | ICD-10-CM
